# Patient Record
Sex: Male | Born: 1980 | Race: Black or African American | Hispanic: No | Marital: Single | Smoking: Current every day smoker
Health system: Southern US, Community
[De-identification: ages and names within clinical notes are randomized; demographics above are authoritative.]

## PROBLEM LIST (undated history)

## (undated) DIAGNOSIS — Z789 Other specified health status: Secondary | ICD-10-CM

## (undated) DIAGNOSIS — R7303 Prediabetes: Secondary | ICD-10-CM

---

## 2015-11-03 ENCOUNTER — Emergency Department (HOSPITAL_COMMUNITY): Payer: Self-pay

## 2015-11-03 ENCOUNTER — Encounter (HOSPITAL_COMMUNITY): Payer: Self-pay

## 2015-11-03 ENCOUNTER — Emergency Department (HOSPITAL_COMMUNITY)
Admission: EM | Admit: 2015-11-03 | Discharge: 2015-11-03 | Disposition: A | Discharge status: Home / Self Care | Payer: Self-pay | Attending: Physician Assistant | Admitting: Physician Assistant

## 2015-11-03 DIAGNOSIS — R1084 Generalized abdominal pain: Secondary | ICD-10-CM | POA: Insufficient documentation

## 2015-11-03 DIAGNOSIS — F17210 Nicotine dependence, cigarettes, uncomplicated: Secondary | ICD-10-CM | POA: Insufficient documentation

## 2015-11-03 LAB — COMPREHENSIVE METABOLIC PANEL
ALBUMIN: 4 g/dL (ref 3.5–5.0)
ALT: 18 U/L (ref 17–63)
ANION GAP: 7 (ref 5–15)
AST: 24 U/L (ref 15–41)
Alkaline Phosphatase: 73 U/L (ref 38–126)
BILIRUBIN TOTAL: 0.4 mg/dL (ref 0.3–1.2)
BUN: 7 mg/dL (ref 6–20)
CALCIUM: 9 mg/dL (ref 8.9–10.3)
CO2: 24 mmol/L (ref 22–32)
Chloride: 106 mmol/L (ref 101–111)
Creatinine, Ser: 1.01 mg/dL (ref 0.61–1.24)
GLUCOSE: 102 mg/dL — AB (ref 65–99)
POTASSIUM: 3.5 mmol/L (ref 3.5–5.1)
Sodium: 137 mmol/L (ref 135–145)
TOTAL PROTEIN: 7.1 g/dL (ref 6.5–8.1)

## 2015-11-03 LAB — CBC
HEMATOCRIT: 45.3 % (ref 39.0–52.0)
HEMOGLOBIN: 15.2 g/dL (ref 13.0–17.0)
MCH: 26.2 pg (ref 26.0–34.0)
MCHC: 33.6 g/dL (ref 30.0–36.0)
MCV: 78 fL (ref 78.0–100.0)
Platelets: 186 10*3/uL (ref 150–400)
RBC: 5.81 MIL/uL (ref 4.22–5.81)
RDW: 14 % (ref 11.5–15.5)
WBC: 9.9 10*3/uL (ref 4.0–10.5)

## 2015-11-03 LAB — URINALYSIS, ROUTINE W REFLEX MICROSCOPIC
BILIRUBIN URINE: NEGATIVE
Glucose, UA: NEGATIVE mg/dL
Hgb urine dipstick: NEGATIVE
Ketones, ur: NEGATIVE mg/dL
LEUKOCYTES UA: NEGATIVE
NITRITE: NEGATIVE
PH: 6 (ref 5.0–8.0)
Protein, ur: NEGATIVE mg/dL
SPECIFIC GRAVITY, URINE: 1.027 (ref 1.005–1.030)

## 2015-11-03 LAB — LIPASE, BLOOD: Lipase: 20 U/L (ref 11–51)

## 2015-11-03 MED ORDER — SODIUM CHLORIDE 0.9 % IV BOLUS (SEPSIS)
1000.0000 mL | Freq: Once | INTRAVENOUS | Status: AC
  Administered 2015-11-03: 1000 mL via INTRAVENOUS

## 2015-11-03 MED ORDER — IOPAMIDOL (ISOVUE-300) INJECTION 61%
INTRAVENOUS | Status: AC
  Administered 2015-11-03: 100 mL
  Filled 2015-11-03: qty 100

## 2015-11-03 MED ORDER — MORPHINE SULFATE (PF) 4 MG/ML IV SOLN
4.0000 mg | Freq: Once | INTRAVENOUS | Status: AC
  Administered 2015-11-03: 4 mg via INTRAVENOUS
  Filled 2015-11-03: qty 1

## 2015-11-03 MED ORDER — POLYETHYLENE GLYCOL 3350 17 G PO PACK: 17.0000 g | PACK | Freq: Every day | ORAL | 0 refills | Status: DC

## 2015-11-03 MED ORDER — ONDANSETRON HCL 4 MG/2ML IJ SOLN
4.0000 mg | Freq: Once | INTRAMUSCULAR | Status: AC
  Administered 2015-11-03: 4 mg via INTRAVENOUS
  Filled 2015-11-03: qty 2

## 2015-11-03 NOTE — ED Notes (Signed)
Patient transported to CT via stretcher.

## 2015-11-03 NOTE — ED Provider Notes (Signed)
TIME SEEN: .5:50 pm  CHIEF COMPLAINT: abdominal pain and constipation  HPI: patient comes to the ER for evaluation of abdominal pain that is diffuse and started this morning,  He has associated constipation. He did have a small bowel movement  With only a little passage of stool. He had sensation of needing to have more bowel movement but unable. Pain radiating to groin and down in left leg. No fever V/N/D, no pain when he urinates, no rectal, no penile discharge  ROS: See HPI Constitutional: no fever  Eyes: no drainage  ENT: no runny nose   Cardiovascular:  no chest pain  Resp: no SOB  GI: no vomiting GU: no dysuria Integumentary: no rash  Allergy: no hives  Musculoskeletal: no leg swelling  Neurological: no slurred speech ROS otherwise negative  PAST MEDICAL HISTORY/PAST SURGICAL HISTORY:  History reviewed. No pertinent past medical history.  MEDICATIONS:  Prior to Admission medications   Not on File    ALLERGIES:  No Known Allergies  SOCIAL HISTORY:  Social History  Substance Use Topics  . Smoking status: Current Every Day Smoker    Types: Cigarettes  . Smokeless tobacco: Never Used  . Alcohol use Not on file    FAMILY HISTORY: No family history on file.  EXAM: BP 129/71 (BP Location: Right Arm)   Pulse 72   Temp 98.7 F (37.1 C) (Oral)   Resp 18   Ht 5\' 9"  (1.753 m)   Wt 74.8 kg   SpO2 98%   BMI 24.37 kg/m  CONSTITUTIONAL: Alert and oriented and responds appropriately to questions. Well-appearing; well-nourished HEAD: Normocephalic EYES: Conjunctivae clear, PERRL ENT: normal nose; no rhinorrhea; moist mucous membranes NECK: Supple, no meningismus, no LAD  CARD: RRR;  no murmurs, no clicks, no rubs, no gallops RESP: Normal chest effort of breathing. No tachypnea, excursion without; breath sounds clear and equal bilaterally; no wheezes, no rhonchi, no rales, no hypoxia or respiratory distress, speaking full sentences ABD/GI: Normal bowel sounds;  non-distended; soft, no rebound, no guarding, no peritoneal signs, mild diffuse tenderness to abdomen. No hernia palpated GU: no testicular tenderness of swelling, no inguinal hernia appreciated. Testicles are symmetrical. BACK:  The back appears normal and is non-tender to palpation, there is no CVA tenderness EXT: Normal ROM in all joints; non-tender to palpation; no edema; normal capillary refill; no cyanosis, no calf tenderness or swelling    SKIN: Normal color for age and race; warm; no rash NEURO: Moves all extremities equally, sensation to light touch intact diffusely, cranial nerves II through XII intact PSYCH: The patient's mood and manner are appropriate. Grooming and personal hygiene are appropriate.  ED COURSE:  Since urinalysis, CBC, BMC and CT abdomen and pelvis are unremarkable.  Patient is nontoxic, nonseptic appearing, in no apparent distress.  Patient's pain and other symptoms adequately managed in emergency department.  Fluid bolus given.  Labs, imaging and vitals reviewed.  Patient does not meet the SIRS or Sepsis criteria.  On repeat exam patient does not have a surgical abdomin and there are no peritoneal signs.  No indication of appendicitis, bowel obstruction, bowel perforation, cholecystitis, diverticulitis. .  Patient discharged home with symptomatic treatment and given strict instructions for follow-up with their primary care physician.  I have also discussed reasons to return immediately to the ER.  Patient expresses understanding and agrees with plan.  Rx: Miralax  Diagnosis: Abdominal pain       Marlon Peliffany Issaih Kaus, PA-C 11/03/15 1951    Courteney Lyn Mackuen,  MD 11/08/15 1610

## 2015-11-03 NOTE — ED Triage Notes (Signed)
Patient here with generalized abdominal pain with radiation to testicles since early am, denies urinary systems. Patient denies nausea, denies vomiting, denies diarrhea

## 2017-06-24 ENCOUNTER — Emergency Department (HOSPITAL_COMMUNITY): Payer: Self-pay

## 2017-06-24 ENCOUNTER — Encounter (HOSPITAL_COMMUNITY): Payer: Self-pay | Admitting: Emergency Medicine

## 2017-06-24 DIAGNOSIS — F17210 Nicotine dependence, cigarettes, uncomplicated: Secondary | ICD-10-CM | POA: Diagnosis present

## 2017-06-24 DIAGNOSIS — K6131 Horseshoe abscess: Principal | ICD-10-CM | POA: Diagnosis present

## 2017-06-24 DIAGNOSIS — E119 Type 2 diabetes mellitus without complications: Secondary | ICD-10-CM | POA: Diagnosis present

## 2017-06-24 LAB — CBC
HEMATOCRIT: 41.1 % (ref 39.0–52.0)
HEMOGLOBIN: 13.8 g/dL (ref 13.0–17.0)
MCH: 26 pg (ref 26.0–34.0)
MCHC: 33.6 g/dL (ref 30.0–36.0)
MCV: 77.4 fL — ABNORMAL LOW (ref 78.0–100.0)
Platelets: 302 10*3/uL (ref 150–400)
RBC: 5.31 MIL/uL (ref 4.22–5.81)
RDW: 12.9 % (ref 11.5–15.5)
WBC: 15.2 10*3/uL — ABNORMAL HIGH (ref 4.0–10.5)

## 2017-06-24 LAB — COMPREHENSIVE METABOLIC PANEL
ALBUMIN: 2.7 g/dL — AB (ref 3.5–5.0)
ALT: 15 U/L — ABNORMAL LOW (ref 17–63)
ANION GAP: 12 (ref 5–15)
AST: 25 U/L (ref 15–41)
Alkaline Phosphatase: 93 U/L (ref 38–126)
BUN: <5 mg/dL — ABNORMAL LOW (ref 6–20)
CO2: 24 mmol/L (ref 22–32)
Calcium: 8.4 mg/dL — ABNORMAL LOW (ref 8.9–10.3)
Chloride: 94 mmol/L — ABNORMAL LOW (ref 101–111)
Creatinine, Ser: 0.94 mg/dL (ref 0.61–1.24)
GFR calc Af Amer: >60 mL/min (ref >60)
GFR calc non Af Amer: >60 mL/min (ref >60)
GLUCOSE: 187 mg/dL — AB (ref 65–99)
POTASSIUM: 3.2 mmol/L — AB (ref 3.5–5.1)
SODIUM: 130 mmol/L — AB (ref 135–145)
Total Bilirubin: 0.4 mg/dL (ref 0.3–1.2)
Total Protein: 7.1 g/dL (ref 6.5–8.1)

## 2017-06-24 LAB — URINALYSIS, ROUTINE W REFLEX MICROSCOPIC
BILIRUBIN URINE: NEGATIVE
Glucose, UA: 150 mg/dL — AB
HGB URINE DIPSTICK: NEGATIVE
Ketones, ur: NEGATIVE mg/dL
Leukocytes, UA: NEGATIVE
NITRITE: NEGATIVE
PH: 6 (ref 5.0–8.0)
Protein, ur: NEGATIVE mg/dL
SPECIFIC GRAVITY, URINE: 1.018 (ref 1.005–1.030)

## 2017-06-24 NOTE — ED Triage Notes (Signed)
Pt reports rectal pain X1 week, pt states he thinks he has a hemorrhoid. Pt also reports L testicle pain X few days, states all this pain started after having a cold with several coughing episodes.

## 2017-06-25 ENCOUNTER — Emergency Department (HOSPITAL_COMMUNITY): Payer: Self-pay | Admitting: Anesthesiology

## 2017-06-25 ENCOUNTER — Encounter (HOSPITAL_COMMUNITY): Admission: EM | Disposition: A | Discharge status: Home / Self Care | Payer: Self-pay | Source: Home / Self Care

## 2017-06-25 ENCOUNTER — Emergency Department (HOSPITAL_COMMUNITY): Payer: Self-pay

## 2017-06-25 ENCOUNTER — Encounter (HOSPITAL_COMMUNITY): Payer: Self-pay | Admitting: Anesthesiology

## 2017-06-25 ENCOUNTER — Inpatient Hospital Stay (HOSPITAL_COMMUNITY)
Admission: EM | Admit: 2017-06-25 | Discharge: 2017-06-27 | DRG: 395 | Disposition: A | Discharge status: Home / Self Care | Payer: Self-pay | Attending: General Surgery | Admitting: General Surgery

## 2017-06-25 ENCOUNTER — Other Ambulatory Visit: Payer: Self-pay

## 2017-06-25 DIAGNOSIS — N50812 Left testicular pain: Secondary | ICD-10-CM

## 2017-06-25 DIAGNOSIS — D72829 Elevated white blood cell count, unspecified: Secondary | ICD-10-CM

## 2017-06-25 DIAGNOSIS — K611 Rectal abscess: Secondary | ICD-10-CM | POA: Diagnosis present

## 2017-06-25 DIAGNOSIS — K6289 Other specified diseases of anus and rectum: Secondary | ICD-10-CM

## 2017-06-25 DIAGNOSIS — R102 Pelvic and perineal pain: Secondary | ICD-10-CM

## 2017-06-25 HISTORY — PX: ABSCESS DRAINAGE: SHX1119

## 2017-06-25 HISTORY — PX: INCISION AND DRAINAGE PERIRECTAL ABSCESS: SHX1804

## 2017-06-25 HISTORY — DX: Other specified health status: Z78.9

## 2017-06-25 LAB — HIV ANTIBODY (ROUTINE TESTING W REFLEX): HIV SCREEN 4TH GENERATION: NONREACTIVE

## 2017-06-25 LAB — CBC
HCT: 38.7 % — ABNORMAL LOW (ref 39.0–52.0)
HEMOGLOBIN: 12.6 g/dL — AB (ref 13.0–17.0)
MCH: 25.3 pg — AB (ref 26.0–34.0)
MCHC: 32.6 g/dL (ref 30.0–36.0)
MCV: 77.6 fL — ABNORMAL LOW (ref 78.0–100.0)
Platelets: 288 10*3/uL (ref 150–400)
RBC: 4.99 MIL/uL (ref 4.22–5.81)
RDW: 13 % (ref 11.5–15.5)
WBC: 17.8 10*3/uL — AB (ref 4.0–10.5)

## 2017-06-25 LAB — HEMOGLOBIN A1C
Hgb A1c MFr Bld: 5.9 % — ABNORMAL HIGH (ref 4.8–5.6)
Mean Plasma Glucose: 122.63 mg/dL

## 2017-06-25 LAB — GLUCOSE, CAPILLARY
GLUCOSE-CAPILLARY: 111 mg/dL — AB (ref 65–99)
GLUCOSE-CAPILLARY: 166 mg/dL — AB (ref 65–99)
GLUCOSE-CAPILLARY: 100 mg/dL — AB (ref 65–99)

## 2017-06-25 LAB — CREATININE, SERUM
CREATININE: 0.8 mg/dL (ref 0.61–1.24)
GFR calc non Af Amer: >60 mL/min (ref >60)

## 2017-06-25 LAB — POC OCCULT BLOOD, ED: FECAL OCCULT BLD: NEGATIVE

## 2017-06-25 LAB — I-STAT CG4 LACTIC ACID, ED: Lactic Acid, Venous: 1.01 mmol/L (ref 0.5–1.9)

## 2017-06-25 SURGERY — Surgical Case
Anesthesia: *Unknown

## 2017-06-25 SURGERY — EXAM UNDER ANESTHESIA
Anesthesia: General | Site: Rectum

## 2017-06-25 MED ORDER — DEXAMETHASONE SODIUM PHOSPHATE 10 MG/ML IJ SOLN
INTRAMUSCULAR | Status: AC
  Filled 2017-06-25: qty 1

## 2017-06-25 MED ORDER — ONDANSETRON HCL 4 MG/2ML IJ SOLN
INTRAMUSCULAR | Status: AC
  Filled 2017-06-25: qty 2

## 2017-06-25 MED ORDER — LACTATED RINGERS IV SOLN
INTRAVENOUS | Status: DC
  Administered 2017-06-25: 09:00:00 via INTRAVENOUS

## 2017-06-25 MED ORDER — FENTANYL CITRATE (PF) 100 MCG/2ML IJ SOLN
50.0000 ug | Freq: Once | INTRAMUSCULAR | Status: AC
  Administered 2017-06-25: 50 ug via INTRAVENOUS
  Filled 2017-06-25: qty 2

## 2017-06-25 MED ORDER — ONDANSETRON HCL 4 MG/2ML IJ SOLN: 4.0000 mg | Freq: Once | INTRAMUSCULAR | Status: DC | PRN

## 2017-06-25 MED ORDER — SUCCINYLCHOLINE CHLORIDE 200 MG/10ML IV SOSY
PREFILLED_SYRINGE | INTRAVENOUS | Status: DC | PRN
  Administered 2017-06-25: 120 mg via INTRAVENOUS

## 2017-06-25 MED ORDER — ACETAMINOPHEN 650 MG RE SUPP: 650.0000 mg | Freq: Four times a day (QID) | RECTAL | Status: DC | PRN

## 2017-06-25 MED ORDER — LIDOCAINE HCL (CARDIAC) 20 MG/ML IV SOLN
INTRAVENOUS | Status: AC
  Filled 2017-06-25: qty 5

## 2017-06-25 MED ORDER — SODIUM CHLORIDE 0.9 % IV BOLUS (SEPSIS)
1000.0000 mL | Freq: Once | INTRAVENOUS | Status: AC
  Administered 2017-06-25: 1000 mL via INTRAVENOUS

## 2017-06-25 MED ORDER — IOPAMIDOL (ISOVUE-300) INJECTION 61%
INTRAVENOUS | Status: AC
  Administered 2017-06-25: 100 mL
  Filled 2017-06-25: qty 100

## 2017-06-25 MED ORDER — METHOCARBAMOL 500 MG PO TABS
500.0000 mg | ORAL_TABLET | Freq: Three times a day (TID) | ORAL | Status: DC | PRN
  Administered 2017-06-25 – 2017-06-26 (×3): 500 mg via ORAL
  Filled 2017-06-25 (×3): qty 1

## 2017-06-25 MED ORDER — DEXAMETHASONE SODIUM PHOSPHATE 10 MG/ML IJ SOLN
INTRAMUSCULAR | Status: DC | PRN
  Administered 2017-06-25: 10 mg via INTRAVENOUS

## 2017-06-25 MED ORDER — SODIUM CHLORIDE 0.9 % IV SOLN: 2.0000 g | Freq: Every day | INTRAVENOUS | Status: DC

## 2017-06-25 MED ORDER — BUPIVACAINE-EPINEPHRINE 0.25% -1:200000 IJ SOLN
INTRAMUSCULAR | Status: DC | PRN
  Administered 2017-06-25: 14 mL

## 2017-06-25 MED ORDER — MORPHINE SULFATE (PF) 4 MG/ML IV SOLN: 2.0000 mg | INTRAVENOUS | Status: DC | PRN

## 2017-06-25 MED ORDER — FENTANYL CITRATE (PF) 250 MCG/5ML IJ SOLN
INTRAMUSCULAR | Status: AC
  Filled 2017-06-25: qty 5

## 2017-06-25 MED ORDER — SUCCINYLCHOLINE CHLORIDE 200 MG/10ML IV SOSY
PREFILLED_SYRINGE | INTRAVENOUS | Status: AC
  Filled 2017-06-25: qty 10

## 2017-06-25 MED ORDER — VANCOMYCIN HCL 10 G IV SOLR
1500.0000 mg | Freq: Two times a day (BID) | INTRAVENOUS | Status: DC
  Administered 2017-06-25: 1500 mg via INTRAVENOUS
  Filled 2017-06-25 (×2): qty 1500

## 2017-06-25 MED ORDER — PIPERACILLIN-TAZOBACTAM 3.375 G IVPB
3.3750 g | Freq: Three times a day (TID) | INTRAVENOUS | Status: DC
  Administered 2017-06-25 – 2017-06-27 (×7): 3.375 g via INTRAVENOUS
  Filled 2017-06-25 (×8): qty 50

## 2017-06-25 MED ORDER — ONDANSETRON HCL 4 MG/2ML IJ SOLN
INTRAMUSCULAR | Status: DC | PRN
  Administered 2017-06-25: 4 mg via INTRAVENOUS

## 2017-06-25 MED ORDER — METOPROLOL TARTRATE 5 MG/5ML IV SOLN: 5.0000 mg | Freq: Four times a day (QID) | INTRAVENOUS | Status: DC | PRN

## 2017-06-25 MED ORDER — PIPERACILLIN-TAZOBACTAM 3.375 G IVPB 30 MIN
3.3750 g | Freq: Once | INTRAVENOUS | Status: AC
  Administered 2017-06-25: 3.375 g via INTRAVENOUS
  Filled 2017-06-25: qty 50

## 2017-06-25 MED ORDER — PROPOFOL 10 MG/ML IV BOLUS
INTRAVENOUS | Status: DC | PRN
  Administered 2017-06-25: 200 mg via INTRAVENOUS

## 2017-06-25 MED ORDER — ENOXAPARIN SODIUM 40 MG/0.4ML ~~LOC~~ SOLN
40.0000 mg | SUBCUTANEOUS | Status: DC
  Administered 2017-06-25 – 2017-06-26 (×2): 40 mg via SUBCUTANEOUS
  Filled 2017-06-25 (×2): qty 0.4

## 2017-06-25 MED ORDER — FENTANYL CITRATE (PF) 250 MCG/5ML IJ SOLN
INTRAMUSCULAR | Status: DC | PRN
  Administered 2017-06-25 (×4): 50 ug via INTRAVENOUS
  Administered 2017-06-25 (×2): 100 ug via INTRAVENOUS

## 2017-06-25 MED ORDER — VANCOMYCIN HCL IN DEXTROSE 1-5 GM/200ML-% IV SOLN: 1000.0000 mg | Freq: Once | INTRAVENOUS | Status: DC

## 2017-06-25 MED ORDER — DIPHENHYDRAMINE HCL 50 MG/ML IJ SOLN: 25.0000 mg | Freq: Four times a day (QID) | INTRAMUSCULAR | Status: DC | PRN

## 2017-06-25 MED ORDER — OXYCODONE HCL 5 MG/5ML PO SOLN: 5.0000 mg | Freq: Once | ORAL | Status: DC | PRN

## 2017-06-25 MED ORDER — FENTANYL CITRATE (PF) 100 MCG/2ML IJ SOLN: 25.0000 ug | INTRAMUSCULAR | Status: DC | PRN

## 2017-06-25 MED ORDER — MIDAZOLAM HCL 2 MG/2ML IJ SOLN
INTRAMUSCULAR | Status: AC
  Filled 2017-06-25: qty 2

## 2017-06-25 MED ORDER — DIPHENHYDRAMINE HCL 25 MG PO CAPS: 25.0000 mg | ORAL_CAPSULE | Freq: Four times a day (QID) | ORAL | Status: DC | PRN

## 2017-06-25 MED ORDER — CLINDAMYCIN PHOSPHATE 600 MG/50ML IV SOLN: 600.0000 mg | Freq: Once | INTRAVENOUS | Status: DC

## 2017-06-25 MED ORDER — LIDOCAINE HCL 2 % EX GEL
CUTANEOUS | Status: AC
  Filled 2017-06-25: qty 20

## 2017-06-25 MED ORDER — INSULIN ASPART 100 UNIT/ML ~~LOC~~ SOLN
0.0000 [IU] | Freq: Three times a day (TID) | SUBCUTANEOUS | Status: DC
  Administered 2017-06-25: 3 [IU] via SUBCUTANEOUS

## 2017-06-25 MED ORDER — ONDANSETRON 4 MG PO TBDP: 4.0000 mg | ORAL_TABLET | Freq: Four times a day (QID) | ORAL | Status: DC | PRN

## 2017-06-25 MED ORDER — LIDOCAINE 2% (20 MG/ML) 5 ML SYRINGE
INTRAMUSCULAR | Status: DC | PRN
  Administered 2017-06-25: 60 mg via INTRAVENOUS

## 2017-06-25 MED ORDER — 0.9 % SODIUM CHLORIDE (POUR BTL) OPTIME
TOPICAL | Status: DC | PRN
  Administered 2017-06-25: 1000 mL

## 2017-06-25 MED ORDER — ACETAMINOPHEN 325 MG PO TABS
650.0000 mg | ORAL_TABLET | Freq: Four times a day (QID) | ORAL | Status: DC | PRN
  Administered 2017-06-26: 650 mg via ORAL
  Filled 2017-06-25: qty 2

## 2017-06-25 MED ORDER — POTASSIUM CHLORIDE 10 MEQ/100ML IV SOLN
10.0000 meq | Freq: Once | INTRAVENOUS | Status: AC
  Administered 2017-06-25: 10 meq via INTRAVENOUS
  Filled 2017-06-25: qty 100

## 2017-06-25 MED ORDER — OXYCODONE HCL 5 MG PO TABS: 5.0000 mg | ORAL_TABLET | ORAL | Status: DC | PRN

## 2017-06-25 MED ORDER — OXYCODONE HCL 5 MG PO TABS: 5.0000 mg | ORAL_TABLET | Freq: Once | ORAL | Status: DC | PRN

## 2017-06-25 MED ORDER — CLINDAMYCIN PHOSPHATE 900 MG/50ML IV SOLN
900.0000 mg | Freq: Once | INTRAVENOUS | Status: AC
  Administered 2017-06-25: 900 mg via INTRAVENOUS
  Filled 2017-06-25: qty 50

## 2017-06-25 MED ORDER — MIDAZOLAM HCL 5 MG/5ML IJ SOLN
INTRAMUSCULAR | Status: DC | PRN
  Administered 2017-06-25: 2 mg via INTRAVENOUS

## 2017-06-25 MED ORDER — OXYCODONE HCL 5 MG PO TABS
5.0000 mg | ORAL_TABLET | ORAL | Status: DC | PRN
  Administered 2017-06-25 – 2017-06-26 (×2): 5 mg via ORAL
  Filled 2017-06-25 (×2): qty 1

## 2017-06-25 MED ORDER — BUPIVACAINE-EPINEPHRINE (PF) 0.25% -1:200000 IJ SOLN
INTRAMUSCULAR | Status: AC
  Filled 2017-06-25: qty 30

## 2017-06-25 MED ORDER — ONDANSETRON HCL 4 MG/2ML IJ SOLN: 4.0000 mg | Freq: Four times a day (QID) | INTRAMUSCULAR | Status: DC | PRN

## 2017-06-25 MED ORDER — MORPHINE SULFATE (PF) 4 MG/ML IV SOLN: 1.0000 mg | INTRAVENOUS | Status: DC | PRN

## 2017-06-25 MED ORDER — KCL IN DEXTROSE-NACL 20-5-0.45 MEQ/L-%-% IV SOLN
INTRAVENOUS | Status: DC
  Administered 2017-06-25 – 2017-06-26 (×2): via INTRAVENOUS
  Filled 2017-06-25 (×2): qty 1000

## 2017-06-25 MED ORDER — SODIUM CHLORIDE 0.9 % IV SOLN
1000.0000 mL | INTRAVENOUS | Status: DC
  Administered 2017-06-25: 1000 mL via INTRAVENOUS

## 2017-06-25 MED ORDER — PROPOFOL 10 MG/ML IV BOLUS
INTRAVENOUS | Status: AC
  Filled 2017-06-25: qty 40

## 2017-06-25 MED ORDER — ACETAMINOPHEN 325 MG PO TABS: 650.0000 mg | ORAL_TABLET | Freq: Four times a day (QID) | ORAL | Status: DC | PRN

## 2017-06-25 SURGICAL SUPPLY — 42 items
CANISTER SUCT 3000ML PPV (MISCELLANEOUS) ×3 IMPLANT
COVER SURGICAL LIGHT HANDLE (MISCELLANEOUS) ×3 IMPLANT
DECANTER SPIKE VIAL GLASS SM (MISCELLANEOUS) ×3 IMPLANT
DRAIN PENROSE 1/2X12 LTX STRL (WOUND CARE) ×3 IMPLANT
DRAPE LAPAROTOMY 100X72 PEDS (DRAPES) ×3 IMPLANT
ELECT CAUTERY BLADE 6.4 (BLADE) ×3 IMPLANT
ELECT REM PT RETURN 9FT ADLT (ELECTROSURGICAL) ×3
ELECTRODE REM PT RTRN 9FT ADLT (ELECTROSURGICAL) ×1 IMPLANT
GAUZE PACKING IODOFORM 2 (PACKING) ×3 IMPLANT
GAUZE SPONGE 4X4 12PLY STRL (GAUZE/BANDAGES/DRESSINGS) ×3 IMPLANT
GLOVE BIO SURGEON STRL SZ 6.5 (GLOVE) ×2 IMPLANT
GLOVE BIO SURGEON STRL SZ7 (GLOVE) ×3 IMPLANT
GLOVE BIO SURGEONS STRL SZ 6.5 (GLOVE) ×1
GLOVE BIOGEL PI IND STRL 6.5 (GLOVE) ×1 IMPLANT
GLOVE BIOGEL PI IND STRL 7.5 (GLOVE) ×1 IMPLANT
GLOVE BIOGEL PI IND STRL 8.5 (GLOVE) ×1 IMPLANT
GLOVE BIOGEL PI INDICATOR 6.5 (GLOVE) ×2
GLOVE BIOGEL PI INDICATOR 7.5 (GLOVE) ×2
GLOVE BIOGEL PI INDICATOR 8.5 (GLOVE) ×2
GLOVE ECLIPSE 8.0 STRL XLNG CF (GLOVE) ×3 IMPLANT
GOWN STRL REUS W/ TWL LRG LVL3 (GOWN DISPOSABLE) ×2 IMPLANT
GOWN STRL REUS W/ TWL XL LVL3 (GOWN DISPOSABLE) ×1 IMPLANT
GOWN STRL REUS W/TWL LRG LVL3 (GOWN DISPOSABLE) ×4
GOWN STRL REUS W/TWL XL LVL3 (GOWN DISPOSABLE) ×2
KIT BASIN OR (CUSTOM PROCEDURE TRAY) ×3 IMPLANT
KIT TURNOVER KIT B (KITS) ×3 IMPLANT
NS IRRIG 1000ML POUR BTL (IV SOLUTION) ×3 IMPLANT
PACK GENERAL/GYN (CUSTOM PROCEDURE TRAY) ×3 IMPLANT
PAD ABD 8X10 STRL (GAUZE/BANDAGES/DRESSINGS) ×3 IMPLANT
PAD ARMBOARD 7.5X6 YLW CONV (MISCELLANEOUS) ×6 IMPLANT
SPONGE SURGIFOAM ABS GEL 100 (HEMOSTASIS) IMPLANT
STAPLER VISISTAT 35W (STAPLE) ×3 IMPLANT
SURGILUBE 2OZ TUBE FLIPTOP (MISCELLANEOUS) IMPLANT
SUT CHROMIC 2 0 SH (SUTURE) IMPLANT
SUT ETHILON 2 0 FS 18 (SUTURE) ×3 IMPLANT
SUT MON AB 3-0 SH 27 (SUTURE)
SUT MON AB 3-0 SH27 (SUTURE) IMPLANT
SWAB COLLECTION DEVICE MRSA (MISCELLANEOUS) ×3 IMPLANT
SWAB CULTURE ESWAB REG 1ML (MISCELLANEOUS) ×3 IMPLANT
SYR CONTROL 10ML LL (SYRINGE) ×3 IMPLANT
TOWEL GREEN STERILE (TOWEL DISPOSABLE) ×3 IMPLANT
UNDERPAD 30X30 (UNDERPADS AND DIAPERS) ×3 IMPLANT

## 2017-06-25 NOTE — ED Provider Notes (Signed)
[facility]   CSN: [id number] Arrival date & time: 06/24/17  2213     History   Chief Complaint Chief Complaint  Patient presents with  . Rectal Pain  . Testicle Pain    HPI Eugene Shelton is a 37 y.o. male with a hx of no major medical problems presents to the Emergency Department complaining of gradual, persistent, progressively worsening rectal and testicular pain onset 1 week ago.  Pt reports recent URI with coughing.  Pt reports his pain is worsening with coughing.  Pt reports "pressure" with any increase in intraabdominal pressure.  Pt has taken aleve and preporation H without relief.  Rectal pain is worsened with sitting and testicular pain is worsened with lifting his testicle.  Pt denies radiation of pain, abd pain, flank pain, N/V, dysuria, hematuria, melena hematochezia, penile discharge.  Last BM was several days ago and does have a hx of constipation, but most recent stools have been loose.  Patient denies melena or hematochezia.  Patient does report multiple episodes of diarrhea since the onset of his URI.  He reports subjective fevers at home.  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  The history is provided by the patient and medical records. No language interpreter was used.    History reviewed. No pertinent past medical history.  There are no active problems to display for this patient.   History reviewed. No pertinent surgical history.      Home Medications    Prior to Admission medications   Medication Sig Start Date End Date Taking? Authorizing Provider  acetaminophen (TYLENOL) 500 MG tablet Take 1,000 mg by mouth every 6 (six) hours as needed for mild pain.   Yes [provider]  ibuprofen (ADVIL,MOTRIN) 200 MG tablet Take 400 mg by mouth every 6 (six) hours as needed for moderate pain.   Yes [provider]  naproxen sodium  (ALEVE) 220 MG tablet Take 220-440 mg by mouth 2 (two) times daily as needed (pain).   Yes [provider]    Family History No family history on file.  Social History Social History   Tobacco Use  . Smoking status: Current Every Day Smoker    Packs/day: 0.50    Types: Cigarettes  . Smokeless tobacco: Never Used  Substance Use Topics  . Alcohol use: Yes    Comment: Socially  . Drug use: Not Currently     Allergies   Patient has no known allergies.   Review of Systems Review of Systems  Constitutional: Negative for appetite change, diaphoresis, fatigue, fever and unexpected weight change.  HENT: Negative for mouth sores.   Eyes: Negative for visual disturbance.  Respiratory: Negative for cough, chest tightness, shortness of breath and wheezing.   Cardiovascular: Negative for chest pain.  Gastrointestinal: Negative for abdominal pain, constipation, diarrhea, nausea and vomiting.       Rectal pain  Endocrine: Negative for polydipsia, polyphagia and polyuria.  Genitourinary: Positive for testicular pain. Negative for discharge, dysuria, frequency, hematuria, penile pain, penile swelling and urgency.  Musculoskeletal: Negative for back pain and neck stiffness.  Skin: Negative for rash.  Allergic/Immunologic: Negative for immunocompromised state.  Neurological: Negative for syncope, light-headedness and headaches.  Hematological: Does not bruise/bleed easily.  Psychiatric/Behavioral: Negative for sleep disturbance. The patient is not nervous/anxious.      Physical Exam Updated Vital Signs BP 127/88 (BP Location: Right Arm)   Pulse (!) 126  Temp 98.7 F (37.1 C) (Oral)   Resp 18   Ht 5\' 10"  (1.778 m)   Wt 70.3 kg (155 lb)   SpO2 100%   BMI 22.24 kg/m   Physical Exam  Constitutional: He appears well-developed and well-nourished. No distress.  Awake, alert, nontoxic appearance  HENT:  Head: Normocephalic and atraumatic.  Mouth/Throat: Oropharynx is  clear and moist. No oropharyngeal exudate.  Eyes: Conjunctivae are normal. No scleral icterus.  Neck: Normal range of motion. Neck supple.  Cardiovascular: Normal rate, regular rhythm and intact distal pulses.  Pulmonary/Chest: Effort normal and breath sounds normal. No respiratory distress. He has no wheezes.  Equal chest expansion  Abdominal: Soft. Bowel sounds are normal. He exhibits no mass. There is no tenderness. There is no rebound and no guarding. Hernia confirmed negative in the right inguinal area and confirmed negative in the left inguinal area.  Genitourinary: Testes normal and penis normal. Rectal exam shows mass and tenderness. Right testis shows no mass, no swelling and no tenderness. Right testis is descended. Cremasteric reflex is not absent on the right side. Left testis shows no mass, no swelling and no tenderness. Left testis is descended. Cremasteric reflex is not absent on the left side. Uncircumcised.  Genitourinary Comments: Chaperone present Severe tenderness to palpation along the perineum, no palpable crepitus Severe tenderness on DRE.  Brown stool in the rectal vault.  Palpable perirectal induration and fluctuance to the 9:00 through 12 o'clock position of the rectum Unable to palpate prostate due to severe pain on DRE.  Musculoskeletal: Normal range of motion. He exhibits no edema.  Lymphadenopathy: Inguinal adenopathy noted on the left side. No inguinal adenopathy noted on the right side.  Neurological: He is alert.  Speech is clear and goal oriented Moves extremities without ataxia  Skin: Skin is warm and dry. He is not diaphoretic.  Psychiatric: He has a normal mood and affect.  Nursing note and vitals reviewed.    ED Treatments / Results  Labs (all labs ordered are listed, but only abnormal results are displayed) Labs Reviewed  COMPREHENSIVE METABOLIC PANEL - Abnormal; Notable for the following components:      Result Value   Sodium 130 (*)    Potassium  3.2 (*)    Chloride 94 (*)    Glucose, Bld 187 (*)    BUN <5 (*)    Calcium 8.4 (*)    Albumin 2.7 (*)    ALT 15 (*)    All other components within normal limits  CBC - Abnormal; Notable for the following components:   WBC 15.2 (*)    MCV 77.4 (*)    All other components within normal limits  URINALYSIS, ROUTINE W REFLEX MICROSCOPIC - Abnormal; Notable for the following components:   Glucose, UA 150 (*)    All other components within normal limits  CULTURE, BLOOD (ROUTINE X 2)  CULTURE, BLOOD (ROUTINE X 2)  HIV ANTIBODY (ROUTINE TESTING)  POC OCCULT BLOOD, ED  I-STAT CG4 LACTIC ACID, ED  I-STAT CG4 LACTIC ACID, ED     Radiology Ct Abdomen Pelvis W Contrast  Result Date: 06/25/2017 CLINICAL DATA:  37 year old male with perianal abscess. EXAM: CT ABDOMEN AND PELVIS WITH CONTRAST TECHNIQUE: Multidetector CT imaging of the abdomen and pelvis was performed using the standard protocol following bolus administration of intravenous contrast. CONTRAST:  ISOVUE-300 IOPAMIDOL (ISOVUE-300) INJECTION 61% COMPARISON:  Abdominal CT dated 11/03/2015 and testicular ultrasound dated 06/24/2017 FINDINGS: Lower chest: The visualized lung bases are clear.  No intra-abdominal free air or free fluid. Hepatobiliary: No focal liver abnormality is seen. No gallstones, gallbladder wall thickening, or biliary dilatation. Pancreas: Unremarkable. No pancreatic ductal dilatation or surrounding inflammatory changes. Spleen: Normal in size without focal abnormality. Adrenals/Urinary Tract: Adrenal glands are unremarkable. Kidneys are normal, without renal calculi, focal lesion, or hydronephrosis. Bladder is unremarkable. Stomach/Bowel: There is a 7 mm ileo ileal intussusception in the left upper abdomen, likely transient. There is no evidence of bowel obstruction or active inflammation. Normal appendix. Vascular/Lymphatic: Mild aortoiliac atherosclerotic disease. The abdominal aorta and IVC are otherwise unremarkable.  No portal venous gas. There is no adenopathy. Reproductive: The prostate and seminal vesicles are grossly unremarkable. No pelvic mass. Other: There is a 5 x 6 x 5 cm complex collection containing debris and gas with thick enhancing peripheral wall in the left and posterior perirectal soft tissues extending to the left perineum most consistent with an abscess. A fistulous tract extends from this collection to the left rectal wall (axial series 3 images 77 and 78 and coronal series 6 images 58-62). Musculoskeletal: No acute or significant osseous findings. IMPRESSION: 1. Perianal abscess with apparent fistulous tract to the lower rectum. Clinical correlation is recommended. 2. No acute intra-abdominopelvic pathology. Probable transient ileo ileal intussusception in the left upper abdomen. No bowel obstruction or active inflammation. Normal appendix. Electronically Signed   By: Elgie Collard M.D.   On: 06/25/2017 05:35   US Scrotum W/doppler  Result Date: 06/25/2017 CLINICAL DATA:  37 year old male with left testicular pain x1 week. EXAM: SCROTAL ULTRASOUND DOPPLER ULTRASOUND OF THE TESTICLES TECHNIQUE: Complete ultrasound examination of the testicles, epididymis, and other scrotal structures was performed. Color and spectral Doppler ultrasound were also utilized to evaluate blood flow to the testicles. COMPARISON:  CT of the abdomen pelvis dated 11/03/2015 FINDINGS: Right testicle Measurements: 4.2 x 2.0 x 2.7 cm. Small scattered areas of microcalcification noted, likely sequela of prior insult. There is no associated mass. Left testicle Measurements: 3.6 x 1.6 x 2.7 cm. Small scattered areas of microcalcification noted, likely sequela of prior insult. There is no associated mass. Right epididymis:  Normal in size and appearance. Left epididymis:  Normal in size and appearance. Hydrocele:  None visualized. Varicocele:  Mild left varicocele. Pulsed Doppler interrogation of both testes demonstrates normal low  resistance arterial and venous waveforms bilaterally. IMPRESSION: 1. No acute findings. Doppler detected bilateral arterial and venous flow. 2. Small foci of calcifications without associated mass, likely sequela of prior infection/inflammation. Clinical correlation is recommended. 3. Mild left testicular varicocele. Electronically Signed   By: Elgie Collard M.D.   On: 06/25/2017 00:07    Procedures Procedures (including critical care time)  Medications Ordered in ED Medications  sodium chloride 0.9 % bolus 1,000 mL (0 mLs Intravenous Stopped 06/25/17 0529)    Followed by  0.9 %  sodium chloride infusion (1,000 mLs Intravenous New Bag/Given 06/25/17 0409)  cefTRIAXone (ROCEPHIN) 2 g in sodium chloride 0.9 % 100 mL IVPB (has no administration in time range)  piperacillin-tazobactam (ZOSYN) IVPB 3.375 g (has no administration in time range)  vancomycin (VANCOCIN) IVPB 1000 mg/200 mL premix (has no administration in time range)  clindamycin (CLEOCIN) IVPB 900 mg (has no administration in time range)  fentaNYL (SUBLIMAZE) injection 50 mcg (50 mcg Intravenous Given 06/25/17 0408)  iopamidol (ISOVUE-300) 61 % injection (100 mLs  Contrast Given 06/25/17 0300)  potassium chloride 10 mEq in 100 mL IVPB (0 mEq Intravenous Stopped 06/25/17 0529)     Initial  Impression / Assessment and Plan / ED Course  I have reviewed the triage vital signs and the nursing notes.  Pertinent labs & imaging results that were available during my care of the patient were reviewed by me and considered in my medical decision making (see chart for details).  Clinical Course as of Jun 25 620  Tue Jun 25, 2017  0300 Tachycardic on arrival  Pulse Rate(!): 126 [HM]  0300 Leukocytosis  WBC(!): 15.2 [HM]  0315 Normal  Lactic Acid, Venous: 1.01 [HM]  0611 Discussed with Dr. Sheliah Hatch who will admit   [HM]  0619 Discussed with pharmacy who recommends Vanc, Zosyn and Clinda   [HM]    Clinical Course User Index [HM]  Eugene Shelton, Eugene Shelton, [place]   Patient presents with initial complaints of scrotal and rectal pain.  1.2 patient evaluated in triage and ultrasound of his scrotum was ordered.  On my exam, pain is noted to the rectum and perineum but not within the scrotum or penis.  Ultrasound without evidence of testicular torsion, orchitis or epididymitis.  Concern for Fournier's gangrene versus complicated perirectal abscess.  CT scan shows large perirectal abscess with evidence of air and a fistulous tract.  Lactic acid is normal and patient's tachycardia has improved.  No evidence of sepsis.  Will obtain blood cultures and initiate antibiotics.  Discussed with general surgery who will admit for definitive treatment.  Final Clinical Impressions(s) / ED Diagnoses   Final diagnoses:  Rectal pain  Perirectal abscess  Perineal pain in male  Leukocytosis, unspecified type    ED Discharge Orders    None       Milta Deiters 06/25/17 1610    Palumbo, April, MD 06/25/17 [phone]

## 2017-06-25 NOTE — Op Note (Signed)
Preoperative diagnosis: perirectal abscess Postoperative diagnosis: horseshoe abscess Procedure: incision and drainage perirectal abscess Surgeon: Dr Harden MoMatt Khalil Shelton Asst: Carlena BjornstadBrooke Meuth, PA-C Anesthesia: general EBL minimal Specimens cultures to micro Complications none Drains penrose drain placed  Sponge and needle count correct dispo to recovery stable  Indications: this is a 36 yom with perianal pain, elevated wbc and ct scan consistent with a perirectal abscess. I discussed incision and drainage in the OR.  Procedure: After informed consent was obtained patient was taken to the OR. He was given abx. He had SCDs in place. He was placed under general anesthesia and then rolled into the prone position and appropriately padded.  He was prepped and draped in standard sterile surgical fashion. Timeout was performed.  I performed an anal block with .25% marcaine. I anesthetized the area of induration on the left also. I made an elliptical incision and removed some skin. I then entered into a deep cavity of purulence. Cultures were taken.  There was purulence coming from his anus as well.  I then noted a plane that tracked to the right side and there was a connection.  I eventually made a radial incision on the right to connect these two.  I drained all the infection.  I then irrigated copiously.  I placed a half inch penrose to connect both sides and sutured these in place. I also packed the deep area with iodoform gauze.  Dressings were placed.  He tolerated well, was extubated and transferred to recovery stable.

## 2017-06-25 NOTE — Progress Notes (Signed)
Pharmacy Antibiotic Note  Eugene DacostaDevin Vernell Shelton is a 37 y.o. male admitted on 06/25/2017 with cellulitis.  Pharmacy has been consulted for vancomycin and Zosyn dosing.  Patient with perirectal abscess- surgery to take for I&D. He has received Zosyn 3.375g IV x1, and has orders for vancomycin 1g IV x1 and clindamycin 900mg  IV x1 placed by EDP.  Plan: Vancomycin 1500mg  IV q12h Zosyn 3.375g IV q8h EI Follow renal function, surgery plans, c/s, length of therapy   Height: 5\' 10"  (177.8 cm) Weight: 155 lb (70.3 kg) IBW/kg (Calculated) : 73  Temp (24hrs), Avg:98.7 F (37.1 C), Min:98.7 F (37.1 C), Max:98.7 F (37.1 C)  Recent Labs  Lab 06/24/17 2234 06/25/17 0303  WBC 15.2*  --   CREATININE 0.94  --   LATICACIDVEN  --  1.01    Estimated Creatinine Clearance: 109.1 mL/min (by C-G formula based on SCr of 0.94 mg/dL).    No Known Allergies  Antimicrobials this admission: Vancomycin 4/2 >>  Zosyn 4/2 >>  Clindamycin 4/2 x1  Dose adjustments this admission: n/a  Microbiology results: 4/2 BCx:   Thank you for allowing pharmacy to be a part of this patient's care.  Lesette Frary D. Tekela Garguilo, PharmD, BCPS Clinical Pharmacist Clinical Phone for 06/25/2017 until 3:30pm: [phone] If after 3:30pm, please call main pharmacy at [phone] 06/25/2017 8:07 AM

## 2017-06-25 NOTE — Anesthesia Procedure Notes (Signed)
Procedure Name: Intubation Date/Time: 06/25/2017 9:50 AM Performed by: Adria Dillonkin, Moritz Lever A, CRNA Pre-anesthesia Checklist: Patient identified, Emergency Drugs available, Suction available and Patient being monitored Patient Re-evaluated:Patient Re-evaluated prior to induction Oxygen Delivery Method: Circle system utilized Preoxygenation: Pre-oxygenation with 100% oxygen Induction Type: IV induction Ventilation: Mask ventilation without difficulty Laryngoscope Size: Miller and 3 Grade View: Grade I Tube type: Oral Tube size: 7.5 mm Number of attempts: 1 Airway Equipment and Method: Stylet Placement Confirmation: ETT inserted through vocal cords under direct vision,  positive ETCO2,  CO2 detector and breath sounds checked- equal and bilateral Secured at: 22 cm Tube secured with: Tape Dental Injury: Teeth and Oropharynx as per pre-operative assessment

## 2017-06-25 NOTE — Anesthesia Postprocedure Evaluation (Signed)
Anesthesia Post Note  Patient: Sherilyn Dacostaevin Vernell Kincade  Procedure(s) Performed: EXAM UNDER ANESTHESIA (N/A Rectum) IRRIGATION AND DEBRIDEMENT PERIRECTAL ABSCESS (N/A Rectum)     Patient location during evaluation: PACU Anesthesia Type: General Level of consciousness: awake and alert Pain management: pain level controlled Vital Signs Assessment: post-procedure vital signs reviewed and stable Respiratory status: spontaneous breathing, nonlabored ventilation, respiratory function stable and patient connected to nasal cannula oxygen Cardiovascular status: blood pressure returned to baseline and stable Postop Assessment: no apparent nausea or vomiting Anesthetic complications: no    Last Vitals:  Vitals:   06/25/17 1115 06/25/17 1133  BP:  124/79  Pulse: 97 86  Resp: 12 20  Temp:  36.7 C  SpO2: 97% 98%    Last Pain:  Vitals:   06/25/17 1133  TempSrc: Oral  PainSc:                  Brion Sossamon COKER

## 2017-06-25 NOTE — Anesthesia Preprocedure Evaluation (Signed)
Anesthesia Evaluation  Patient identified by MRN, date of birth, ID band Patient awake    Reviewed: Allergy & Precautions, NPO status , Patient's Chart, lab work & pertinent test results  Airway Mallampati: II  TM Distance: >3 FB Neck ROM: Full    Dental  (+) Teeth Intact, Dental Advisory Given   Pulmonary Current Smoker,    breath sounds clear to auscultation       Cardiovascular  Rhythm:Regular Rate:Normal     Neuro/Psych    GI/Hepatic   Endo/Other    Renal/GU      Musculoskeletal   Abdominal   Peds  Hematology   Anesthesia Other Findings   Reproductive/Obstetrics                             Anesthesia Physical Anesthesia Plan  ASA: II and emergent  Anesthesia Plan: General   Post-op Pain Management:    Induction: Intravenous  PONV Risk Score and Plan: Ondansetron and Dexamethasone  Airway Management Planned: Oral ETT  Additional Equipment:   Intra-op Plan:   Post-operative Plan: Extubation in OR  Informed Consent: I have reviewed the patients History and Physical, chart, labs and discussed the procedure including the risks, benefits and alternatives for the proposed anesthesia with the patient or authorized representative who has indicated his/her understanding and acceptance.   Dental advisory given  Plan Discussed with: CRNA and Anesthesiologist  Anesthesia Plan Comments:         Anesthesia Quick Evaluation  

## 2017-06-25 NOTE — Progress Notes (Signed)
Patient ID: Eugene Shelton, male   DOB: 09/19/1980, 37 y.o.   MRN: [id number] I have seen and examined patient.  He has tender are left buttock in perianal region with some fluctuance.  His wbc elevated and ct confirms that he has perirectal abscess. I discussed incision and drainage in the or with him. We discussed hospital stay, recovery and risks. We also discussed possible fistula that would be dealt with later.

## 2017-06-25 NOTE — Transfer of Care (Signed)
Immediate Anesthesia Transfer of Care Note  Patient: Eugene Shelton  Procedure(s) Performed: EXAM UNDER ANESTHESIA (N/A Rectum) IRRIGATION AND DEBRIDEMENT PERIRECTAL ABSCESS (N/A Rectum)  Patient Location: PACU  Anesthesia Type:General  Level of Consciousness: awake, alert , oriented and patient cooperative  Airway & Oxygen Therapy: Patient Spontanous Breathing and Patient connected to face mask oxygen  Post-op Assessment: Report given to RN and Post -op Vital signs reviewed and stable  Post vital signs: Reviewed and stable  Last Vitals:  Vitals Value Taken Time  BP 115/87 06/25/2017 10:45 AM  Temp    Pulse 46 06/25/2017 10:45 AM  Resp 18 06/25/2017 10:46 AM  SpO2 77 % 06/25/2017 10:45 AM  Vitals shown include unvalidated device data.  Last Pain:  Vitals:   06/25/17 0733  TempSrc:   PainSc: 9          Complications: No apparent anesthesia complications

## 2017-06-25 NOTE — H&P (Addendum)
Eugene Shelton is an 37 y.o. male.   Chief Complaint: perianal pain HPI: 37 yo male with 1 week of perianal pain. Initially he has been under the weather with a URI for the last 3 weeks. During this time he notes pain with coughing in his abdomen and anal areas. He now has a constant pain in his anal area and was concerned about hemorrhoids. He has subjective fevers in the last 2 days. He denies drainage. He has never had a skin infection or abscess before. He has no known immunocompromising diseases and notes poor nutrition in the last 3 weeks.  History reviewed. No pertinent past medical history.  History reviewed. No pertinent surgical history.  No family history on file. Social History:  reports that he has been smoking cigarettes.  He has been smoking about 0.50 packs per day. He has never used smokeless tobacco. He reports that he drinks alcohol. He reports that he has current or past drug history.  Allergies: No Known Allergies   (Not in a hospital admission)  Results for orders placed or performed during the hospital encounter of 06/25/17 (from the past 48 hour(s))  Comprehensive metabolic panel     Status: Abnormal   Collection Time: 06/24/17 10:34 PM  Result Value Ref Range   Sodium 130 (L) 135 - 145 mmol/L   Potassium 3.2 (L) 3.5 - 5.1 mmol/L   Chloride 94 (L) 101 - 111 mmol/L   CO2 24 22 - 32 mmol/L   Glucose, Bld 187 (H) 65 - 99 mg/dL   BUN <5 (L) 6 - 20 mg/dL   Creatinine, Ser 0.94 0.61 - 1.24 mg/dL   Calcium 8.4 (L) 8.9 - 10.3 mg/dL   Total Protein 7.1 6.5 - 8.1 g/dL   Albumin 2.7 (L) 3.5 - 5.0 g/dL   AST 25 15 - 41 U/L   ALT 15 (L) 17 - 63 U/L   Alkaline Phosphatase 93 38 - 126 U/L   Total Bilirubin 0.4 0.3 - 1.2 mg/dL   GFR calc non Af Amer >60 >60 mL/min   GFR calc Af Amer >60 >60 mL/min    Comment: (NOTE) The eGFR has been calculated using the CKD EPI equation. This calculation has not been validated in all clinical situations. eGFR's persistently <60  mL/min signify possible Chronic Kidney Disease.    Anion gap 12 5 - 15    Comment: Performed at [facility] [address], [place], [place] [address]  CBC     Status: Abnormal   Collection Time: 06/24/17 10:34 PM  Result Value Ref Range   WBC 15.2 (H) 4.0 - 10.5 K/uL   RBC 5.31 4.22 - 5.81 MIL/uL   Hemoglobin 13.8 13.0 - 17.0 g/dL   HCT 41.1 39.0 - 52.0 %   MCV 77.4 (L) 78.0 - 100.0 fL   MCH 26.0 26.0 - 34.0 pg   MCHC 33.6 30.0 - 36.0 g/dL   RDW 12.9 11.5 - 15.5 %   Platelets 302 150 - 400 K/uL    Comment: Performed at [facility], [place] [address] [place], [place] [address]  Urinalysis, Routine w reflex microscopic     Status: Abnormal   Collection Time: 06/24/17 10:42 PM  Result Value Ref Range   Color, Urine YELLOW YELLOW   APPearance CLEAR CLEAR   Specific Gravity, Urine 1.018 1.005 - 1.030   pH 6.0 5.0 - 8.0   Glucose, UA 150 (A) NEGATIVE mg/dL   Hgb urine dipstick  NEGATIVE NEGATIVE   Bilirubin Urine NEGATIVE NEGATIVE   Ketones, ur NEGATIVE NEGATIVE mg/dL   Protein, ur NEGATIVE NEGATIVE mg/dL   Nitrite NEGATIVE NEGATIVE   Leukocytes, UA NEGATIVE NEGATIVE    Comment: Performed at [facility] [address], [place], [place] [address]  POC occult blood, ED Provider will collect     Status: None   Collection Time: 06/25/17  2:45 AM  Result Value Ref Range   Fecal Occult Bld NEGATIVE NEGATIVE  I-Stat CG4 Lactic Acid, ED     Status: None   Collection Time: 06/25/17  3:03 AM  Result Value Ref Range   Lactic Acid, Venous 1.01 0.5 - 1.9 mmol/L   Ct Abdomen Pelvis W Contrast  Result Date: 06/25/2017 CLINICAL DATA:  37 year old male with perianal abscess. EXAM: CT ABDOMEN AND PELVIS WITH CONTRAST TECHNIQUE: Multidetector CT imaging of the abdomen and pelvis was performed using the standard protocol following bolus administration of intravenous contrast. CONTRAST:  140m ISOVUE-300 IOPAMIDOL (ISOVUE-300) INJECTION 61% COMPARISON:  Abdominal CT dated  11/03/2015 and testicular ultrasound dated 06/24/2017 FINDINGS: Lower chest: The visualized lung bases are clear. No intra-abdominal free air or free fluid. Hepatobiliary: No focal liver abnormality is seen. No gallstones, gallbladder wall thickening, or biliary dilatation. Pancreas: Unremarkable. No pancreatic ductal dilatation or surrounding inflammatory changes. Spleen: Normal in size without focal abnormality. Adrenals/Urinary Tract: Adrenal glands are unremarkable. Kidneys are normal, without renal calculi, focal lesion, or hydronephrosis. Bladder is unremarkable. Stomach/Bowel: There is a 7 mm ileo ileal intussusception in the left upper abdomen, likely transient. There is no evidence of bowel obstruction or active inflammation. Normal appendix. Vascular/Lymphatic: Mild aortoiliac atherosclerotic disease. The abdominal aorta and IVC are otherwise unremarkable. No portal venous gas. There is no adenopathy. Reproductive: The prostate and seminal vesicles are grossly unremarkable. No pelvic mass. Other: There is a 5 x 6 x 5 cm complex collection containing debris and gas with thick enhancing peripheral wall in the left and posterior perirectal soft tissues extending to the left perineum most consistent with an abscess. A fistulous tract extends from this collection to the left rectal wall (axial series 3 images 77 and 78 and coronal series 6 images 58-62). Musculoskeletal: No acute or significant osseous findings. IMPRESSION: 1. Perianal abscess with apparent fistulous tract to the lower rectum. Clinical correlation is recommended. 2. No acute intra-abdominopelvic pathology. Probable transient ileo ileal intussusception in the left upper abdomen. No bowel obstruction or active inflammation. Normal appendix. Electronically Signed   By: AAnner CreteM.D.   On: 06/25/2017 05:35   UKoreaScrotum W/doppler  Result Date: 06/25/2017 CLINICAL DATA:  37year old male with left testicular pain x1 week. EXAM: SCROTAL  ULTRASOUND DOPPLER ULTRASOUND OF THE TESTICLES TECHNIQUE: Complete ultrasound examination of the testicles, epididymis, and other scrotal structures was performed. Color and spectral Doppler ultrasound were also utilized to evaluate blood flow to the testicles. COMPARISON:  CT of the abdomen pelvis dated 11/03/2015 FINDINGS: Right testicle Measurements: 4.2 x 2.0 x 2.7 cm. Small scattered areas of microcalcification noted, likely sequela of prior insult. There is no associated mass. Left testicle Measurements: 3.6 x 1.6 x 2.7 cm. Small scattered areas of microcalcification noted, likely sequela of prior insult. There is no associated mass. Right epididymis:  Normal in size and appearance. Left epididymis:  Normal in size and appearance. Hydrocele:  None visualized. Varicocele:  Mild left varicocele. Pulsed Doppler interrogation of both testes demonstrates normal low resistance arterial and venous waveforms bilaterally. IMPRESSION: 1. No acute  findings. Doppler detected bilateral arterial and venous flow. 2. Small foci of calcifications without associated mass, likely sequela of prior infection/inflammation. Clinical correlation is recommended. 3. Mild left testicular varicocele. Electronically Signed   By: Anner Crete M.D.   On: 06/25/2017 00:07    Review of Systems  Constitutional: Negative for chills and fever.  HENT: Negative for hearing loss.   Eyes: Negative for blurred vision and double vision.  Respiratory: Positive for cough. Negative for hemoptysis.   Cardiovascular: Negative for chest pain and palpitations.  Gastrointestinal: Negative for abdominal pain, blood in stool, constipation, nausea and vomiting.  Genitourinary: Negative for dysuria and urgency.  Musculoskeletal: Negative for myalgias and neck pain.  Skin: Negative for itching and rash.  Neurological: Negative for dizziness, tingling and headaches.  Endo/Heme/Allergies: Does not bruise/bleed easily.  Psychiatric/Behavioral:  Negative for depression and suicidal ideas.    Blood pressure 123/80, pulse 79, temperature 98.7 F (37.1 C), temperature source Oral, resp. rate 18, height _0  (1.778 m), weight 70.3 kg (155 lb), SpO2 97 %. Physical Exam  Vitals reviewed. Constitutional: He is oriented to person, place, and time. He appears well-developed and well-nourished.  HENT:  Head: Normocephalic and atraumatic.  Eyes: Pupils are equal, round, and reactive to light. Conjunctivae and EOM are normal.  Neck: Normal range of motion. Neck supple.  Cardiovascular: Normal rate and regular rhythm.  Respiratory: Effort normal and breath sounds normal.  GI: Soft. Bowel sounds are normal. He exhibits no distension. There is no tenderness.  Genitourinary:  Genitourinary Comments: Induration and fluctuance on the posterior and right sides of the anal verge, digital exam exquisitely tender  Musculoskeletal: Normal range of motion.  Neurological: He is alert and oriented to person, place, and time.  Skin: Skin is warm and dry.  Psychiatric: He has a normal mood and affect. His behavior is normal.     Assessment/Plan 37 yo male with perirectal abscess which appears to go beyond the dentate and is a horseshoe type -antibiotics -admit to hospital -incision and drainage today -HIV screen -we discussed the etiology perirectal abscesses, that they are best cared for by drainage, they require open incisions with wound care and often sitz baths. We discussed the possibility of a fistula being present now or developing one in the future. He showed understanding and wanted to proceed.  Mickeal Skinner, MD 06/25/2017, 6:30 AM

## 2017-06-25 NOTE — ED Notes (Signed)
Surgeon in room at this time.

## 2017-06-26 ENCOUNTER — Encounter (HOSPITAL_COMMUNITY): Payer: Self-pay | Admitting: General Surgery

## 2017-06-26 LAB — CBC
HCT: 36.6 % — ABNORMAL LOW (ref 39.0–52.0)
Hemoglobin: 12.1 g/dL — ABNORMAL LOW (ref 13.0–17.0)
MCH: 25.4 pg — ABNORMAL LOW (ref 26.0–34.0)
MCHC: 33.1 g/dL (ref 30.0–36.0)
MCV: 76.7 fL — AB (ref 78.0–100.0)
Platelets: 313 10*3/uL (ref 150–400)
RBC: 4.77 MIL/uL (ref 4.22–5.81)
RDW: 13.5 % (ref 11.5–15.5)
WBC: 16.8 10*3/uL — AB (ref 4.0–10.5)

## 2017-06-26 LAB — COMPREHENSIVE METABOLIC PANEL
ALT: 13 U/L — ABNORMAL LOW (ref 17–63)
AST: 22 U/L (ref 15–41)
Albumin: 2.3 g/dL — ABNORMAL LOW (ref 3.5–5.0)
Alkaline Phosphatase: 75 U/L (ref 38–126)
Anion gap: 13 (ref 5–15)
BILIRUBIN TOTAL: 0.6 mg/dL (ref 0.3–1.2)
BUN: <5 mg/dL — ABNORMAL LOW (ref 6–20)
CHLORIDE: 98 mmol/L — AB (ref 101–111)
CO2: 24 mmol/L (ref 22–32)
Calcium: 8.4 mg/dL — ABNORMAL LOW (ref 8.9–10.3)
Creatinine, Ser: 0.79 mg/dL (ref 0.61–1.24)
Glucose, Bld: 110 mg/dL — ABNORMAL HIGH (ref 65–99)
Potassium: 3.7 mmol/L (ref 3.5–5.1)
Sodium: 135 mmol/L (ref 135–145)
TOTAL PROTEIN: 6.3 g/dL — AB (ref 6.5–8.1)

## 2017-06-26 LAB — GLUCOSE, CAPILLARY
GLUCOSE-CAPILLARY: 105 mg/dL — AB (ref 65–99)
GLUCOSE-CAPILLARY: 64 mg/dL — AB (ref 65–99)
GLUCOSE-CAPILLARY: 85 mg/dL (ref 65–99)
Glucose-Capillary: 89 mg/dL (ref 65–99)
Glucose-Capillary: 108 mg/dL — ABNORMAL HIGH (ref 65–99)

## 2017-06-26 MED ORDER — MORPHINE SULFATE (PF) 4 MG/ML IV SOLN: 1.0000 mg | INTRAVENOUS | Status: DC | PRN

## 2017-06-26 MED ORDER — LIVING WELL WITH DIABETES BOOK
Freq: Once | Status: DC
  Filled 2017-06-26: qty 1

## 2017-06-26 MED ORDER — OXYCODONE HCL 5 MG PO TABS
5.0000 mg | ORAL_TABLET | ORAL | Status: DC | PRN
  Administered 2017-06-26 (×2): 5 mg via ORAL
  Filled 2017-06-26 (×2): qty 1

## 2017-06-26 NOTE — Progress Notes (Signed)
Inpatient Diabetes Program Recommendations  AACE/ADA: New Consensus Statement on Inpatient Glycemic Control (2015)  Target Ranges:  Prepandial:   less than 140 mg/dL      Peak postprandial:   less than 180 mg/dL (1-2 hours)      Critically ill patients:  140 - 180 mg/dL   Lab Results  Component Value Date   GLUCAP 105 (H) 06/26/2017   HGBA1C 5.9 (H) 06/24/2017    Review of Glycemic Control Results for Eugene Shelton, Eugene Shelton (MRN [id number]) as of 06/26/2017 14:28  Ref. Range 06/25/2017 16:32 06/25/2017 21:42 06/26/2017 07:35 06/26/2017 12:09  Glucose-Capillary Latest Ref Range: 65 - 99 mg/dL 409166 (H) 811100 (H) 89 914105 (H)   Diabetes history: Prediabetes Outpatient Diabetes medications: none Current orders for Inpatient glycemic control: Novolog 0-15 units TID  Inpatient Diabetes Program Recommendations:     Responded to consult placed by MD for patient with A1C of 5.9%. According to ADA this places patient in the "prediabetes" category. Reviewed patient's current A1c. Explained what a A1c is and what it measures. Also reviewed goal A1c with patient, importance of good glucose control @ home, and blood sugar goals.  Discussed the long term implications of diabetes, the pathophysiology behind diabetes, beta cell function, vascular changes and how insulin responds to sugar. Co morbidities were also discussed in the event of lack of management or poor control; putting patients at higher risks for infections, MI, CVAs and amputations. Education also provided on the impact of early recognition and changes to lifestyle such as diet and exercise.  Patient states that he has been sick the last month and has not been eating as well and drinking sodas. He is usually very active. Patient states, "I usually work up to 80 hours a week, until just recently." We discussed better choices and eliminating sugary beverages and to check BS when eating sugary foods to learn how body responds. Discussed normal BS values and  what to expect.  Encouraged to purchase a [employer] meter at [facility] and begin checking blood sugars occasionally throughout the week. Educated on frequency of checking BS, writing them down and taking diary to PCP.  Patient very receptive to the information and appreciates the discussion. Plans to purchase meter and make some lifestyle modifications once he starts feeling better. Will place order for case management, as he will need a PCP for follow up care.  Thanks, Lujean RaveLauren Maui Britten, MSN, RNC-OB Diabetes Coordinator [phone] (8a-5p)

## 2017-06-26 NOTE — Progress Notes (Signed)
[facility] Progress Note  1 Day Post-Op  Subjective: CC- sore Doing well. States that he is sore but feeling better than yesterday. Tolerating diet. Denies n/v. WBC still elevated at 16.8, TMAX 99.3.  Works at two [employer].   Objective: Vital signs in last 24 hours: Temp:  [98 F (36.7 C)-99.3 F (37.4 C)] 98.6 F (37 C) (04/03 0624) Pulse Rate:  [46-114] 72 (04/03 0624) Resp:  [12-29] 17 (04/03 0624) BP: (105-124)/(67-88) 105/67 (04/03 0624) SpO2:  [93 %-100 %] 99 % (04/03 0624) Last BM Date: 06/23/17  Intake/Output from previous day: 04/02 0701 - 04/03 0700 In: 1653.8 [P.O.:600; I.V.:953.8; IV Piggyback:100] Out: 975 [Urine:900; Blood:75] Intake/Output this shift: No intake/output data recorded.  PE: Gen:  Alert, NAD, pleasant HEENT: EOM's intact, pupils equal and round Card:  RRR, no M/G/R heard Pulm:  effort normal Abd: Soft, NT/ND, +BS Psych: A&Ox3  GU: perirectal abscess s/p I&D with penrose drain in place, no erythema or fluctuance, trace serosanguinous/slightly cloudy drainage from around penrose  Lab Results:  Recent Labs    06/25/17 1148 06/26/17 0416  WBC 17.8* 16.8*  HGB 12.6* 12.1*  HCT 38.7* 36.6*  PLT 288 313   BMET Recent Labs    06/24/17 2234 06/25/17 1148 06/26/17 0416  NA 130*  --  135  K 3.2*  --  3.7  CL 94*  --  98*  CO2 24  --  24  GLUCOSE 187*  --  110*  BUN <5*  --  <5*  CREATININE 0.94 0.80 0.79  CALCIUM 8.4*  --  8.4*   PT/INR No results for input(s): LABPROT, INR in the last 72 hours. CMP     Component Value Date/Time   NA 135 06/26/2017 0416   K 3.7 06/26/2017 0416   CL 98 (L) 06/26/2017 0416   CO2 24 06/26/2017 0416   GLUCOSE 110 (H) 06/26/2017 0416   BUN <5 (L) 06/26/2017 0416   CREATININE 0.79 06/26/2017 0416   CALCIUM 8.4 (L) 06/26/2017 0416   PROT 6.3 (L) 06/26/2017 0416   ALBUMIN 2.3 (L) 06/26/2017 0416   AST 22 06/26/2017 0416   ALT 13 (L) 06/26/2017 0416   ALKPHOS 75  06/26/2017 0416   BILITOT 0.6 06/26/2017 0416   GFRNONAA >60 06/26/2017 0416   GFRAA >60 06/26/2017 0416   Lipase     Component Value Date/Time   LIPASE 20 11/03/2015 1521       Studies/Results: Ct Abdomen Pelvis W Contrast  Result Date: 06/25/2017 CLINICAL DATA:  37 year old male with perianal abscess. EXAM: CT ABDOMEN AND PELVIS WITH CONTRAST TECHNIQUE: Multidetector CT imaging of the abdomen and pelvis was performed using the standard protocol following bolus administration of intravenous contrast. CONTRAST:  ISOVUE-300 IOPAMIDOL (ISOVUE-300) INJECTION 61% COMPARISON:  Abdominal CT dated 11/03/2015 and testicular ultrasound dated 06/24/2017 FINDINGS: Lower chest: The visualized lung bases are clear. No intra-abdominal free air or free fluid. Hepatobiliary: No focal liver abnormality is seen. No gallstones, gallbladder wall thickening, or biliary dilatation. Pancreas: Unremarkable. No pancreatic ductal dilatation or surrounding inflammatory changes. Spleen: Normal in size without focal abnormality. Adrenals/Urinary Tract: Adrenal glands are unremarkable. Kidneys are normal, without renal calculi, focal lesion, or hydronephrosis. Bladder is unremarkable. Stomach/Bowel: There is a 7 mm ileo ileal intussusception in the left upper abdomen, likely transient. There is no evidence of bowel obstruction or active inflammation. Normal appendix. Vascular/Lymphatic: Mild aortoiliac atherosclerotic disease. The abdominal aorta and IVC are otherwise unremarkable. No portal venous gas. There is  no adenopathy. Reproductive: The prostate and seminal vesicles are grossly unremarkable. No pelvic mass. Other: There is a 5 x 6 x 5 cm complex collection containing debris and gas with thick enhancing peripheral wall in the left and posterior perirectal soft tissues extending to the left perineum most consistent with an abscess. A fistulous tract extends from this collection to the left rectal wall (axial series 3  images 77 and 78 and coronal series 6 images 58-62). Musculoskeletal: No acute or significant osseous findings. IMPRESSION: 1. Perianal abscess with apparent fistulous tract to the lower rectum. Clinical correlation is recommended. 2. No acute intra-abdominopelvic pathology. Probable transient ileo ileal intussusception in the left upper abdomen. No bowel obstruction or active inflammation. Normal appendix. Electronically Signed   By: Elgie CollardArash  Radparvar M.D.   On: 06/25/2017 05:35   [place] Scrotum W/doppler  Result Date: 06/25/2017 CLINICAL DATA:  37 year old male with left testicular pain x1 week. EXAM: SCROTAL ULTRASOUND DOPPLER ULTRASOUND OF THE TESTICLES TECHNIQUE: Complete ultrasound examination of the testicles, epididymis, and other scrotal structures was performed. Color and spectral Doppler ultrasound were also utilized to evaluate blood flow to the testicles. COMPARISON:  CT of the abdomen pelvis dated 11/03/2015 FINDINGS: Right testicle Measurements: 4.2 x 2.0 x 2.7 cm. Small scattered areas of microcalcification noted, likely sequela of prior insult. There is no associated mass. Left testicle Measurements: 3.6 x 1.6 x 2.7 cm. Small scattered areas of microcalcification noted, likely sequela of prior insult. There is no associated mass. Right epididymis:  Normal in size and appearance. Left epididymis:  Normal in size and appearance. Hydrocele:  None visualized. Varicocele:  Mild left varicocele. Pulsed Doppler interrogation of both testes demonstrates normal low resistance arterial and venous waveforms bilaterally. IMPRESSION: 1. No acute findings. Doppler detected bilateral arterial and venous flow. 2. Small foci of calcifications without associated mass, likely sequela of prior infection/inflammation. Clinical correlation is recommended. 3. Mild left testicular varicocele. Electronically Signed   By: Elgie CollardArash  Radparvar M.D.   On: 06/25/2017 00:07    Anti-infectives: Anti-infectives (From admission,  onward)   Start     Dose/Rate Route Frequency Ordered Stop   06/25/17 1800  vancomycin (VANCOCIN) 1,500 mg in sodium chloride 0.9 % 500 mL IVPB  Status:  Discontinued     1,500 mg 250 mL/hr over 120 Minutes Intravenous Every 12 hours 06/25/17 0808 06/25/17 1143   06/25/17 1400  piperacillin-tazobactam (ZOSYN) IVPB 3.375 g     3.375 g 12.5 mL/hr over 240 Minutes Intravenous Every 8 hours 06/25/17 0808     06/25/17 0700  cefTRIAXone (ROCEPHIN) 2 g in sodium chloride 0.9 % 100 mL IVPB  Status:  Discontinued     2 g 200 mL/hr over 30 Minutes Intravenous Daily 06/25/17 0614 06/25/17 0639   06/25/17 0630  piperacillin-tazobactam (ZOSYN) IVPB 3.375 g     3.375 g 100 mL/hr over 30 Minutes Intravenous  Once 06/25/17 0617 06/25/17 0812   06/25/17 0630  vancomycin (VANCOCIN) IVPB 1000 mg/200 mL premix  Status:  Discontinued     1,000 mg 200 mL/hr over 60 Minutes Intravenous  Once 06/25/17 0617 06/25/17 1128   06/25/17 0630  clindamycin (CLEOCIN) IVPB 600 mg  Status:  Discontinued     600 mg 100 mL/hr over 30 Minutes Intravenous  Once 06/25/17 0619 06/25/17 0619   06/25/17 0630  clindamycin (CLEOCIN) IVPB 900 mg     900 mg 100 mL/hr over 30 Minutes Intravenous  Once 06/25/17 0619 06/25/17 [id number]  Assessment/Plan Pre-DM - A1c 5.9, diabetes coordinator eval pending  Horseshoe perirectal abscess S/p incision and drainage perirectal abscess 4/2 Dr. Dwain Sarna - POD 1 - intraoperative cultures pending - penrose in place, draining appropriately - WBC 16.8, TMAX 99.3  ID - zosyn 4/2>>day#2 FEN - decrease IVF, carb mod diet VTE - SCDs, lovenox Foley - none Follow up - 1 week with Dr. Dwain Sarna  Plan - Keep packing in place today, plan to remove tomorrow. Continue penrose drain. Continue IV abx and repeat CBC in AM.   LOS: 1 day    Franne Forts , [facility] 06/26/2017, 8:27 AM Pager: [phone] Consults: [phone] Mon-Fri 7:00 am-4:30 pm Sat-Sun 7:00  am-11:30 am

## 2017-06-26 NOTE — Progress Notes (Signed)
ANTIBIOTIC CONSULT NOTE  Pharmacy Consult for Zosyn Indication: perirectal abscess  No Known Allergies  Patient Measurements: Height: 5\' 10"  (177.8 cm) Weight: 155 lb (70.3 kg) IBW/kg (Calculated) : 73 Adjusted Body Weight:    Vital Signs: Temp: 98.6 F (37 C) (04/03 0624) Temp Source: Oral (04/03 0624) BP: 105/67 (04/03 0624) Pulse Rate: 72 (04/03 0624) Intake/Output from previous day: 04/02 0701 - 04/03 0700 In: 1653.8 [P.O.:600; I.V.:953.8; IV Piggyback:100] Out: 975 [Urine:900; Blood:75] Intake/Output from this shift: Total I/O In: 300 [P.O.:300] Out: -   Labs: Recent Labs    06/24/17 2234 06/25/17 1148 06/26/17 0416  WBC 15.2* 17.8* 16.8*  HGB 13.8 12.6* 12.1*  PLT 302 288 313  CREATININE 0.94 0.80 0.79   Estimated Creatinine Clearance: 128.2 mL/min (by C-G formula based on SCr of 0.79 mg/dL). No results for input(s): VANCOTROUGH, VANCOPEAK, VANCORANDOM, GENTTROUGH, GENTPEAK, GENTRANDOM, TOBRATROUGH, TOBRAPEAK, TOBRARND, AMIKACINPEAK, AMIKACINTROU, AMIKACIN in the last 72 hours.   Microbiology:   Medical History: Past Medical History:  Diagnosis Date  . Medical history non-contributory     Assessment: ID: Patient with perirectal abscess- s/p I&D on 4/2. Has a drain. WBC 16.8, Tmax 99.3.CrCl >100  Vancomycin 4/2 >> 4/2 Zosyn 4/2 >>  Clindamycin 4/2 x1  4/2 BCx>>pending 4/2: abscess cxs: GNR, GPC pairs reincubated  Goal of Therapy:  Eradication of infection  Plan:  Zosyn 3.375g IV q 8 hrs. Pharmacy will sign off. Please reconsult for further dosing assitance.   Adisen Bennion S. Merilynn Finlandobertson, PharmD, BCPS Clinical Staff Pharmacist Pager [phone]  Misty Stanleyobertson, Ahuva Poynor Stillinger 06/26/2017,1:08 PM

## 2017-06-27 LAB — BASIC METABOLIC PANEL
ANION GAP: 10 (ref 5–15)
BUN: 6 mg/dL (ref 6–20)
CO2: 22 mmol/L (ref 22–32)
Calcium: 8.6 mg/dL — ABNORMAL LOW (ref 8.9–10.3)
Chloride: 104 mmol/L (ref 101–111)
Creatinine, Ser: 0.83 mg/dL (ref 0.61–1.24)
GFR calc Af Amer: >60 mL/min (ref >60)
GFR calc non Af Amer: >60 mL/min (ref >60)
GLUCOSE: 83 mg/dL (ref 65–99)
Potassium: 4.1 mmol/L (ref 3.5–5.1)
Sodium: 136 mmol/L (ref 135–145)

## 2017-06-27 LAB — GLUCOSE, CAPILLARY: Glucose-Capillary: 77 mg/dL (ref 65–99)

## 2017-06-27 LAB — CBC
HEMATOCRIT: 38.2 % — AB (ref 39.0–52.0)
Hemoglobin: 12.4 g/dL — ABNORMAL LOW (ref 13.0–17.0)
MCH: 24.8 pg — AB (ref 26.0–34.0)
MCHC: 32.5 g/dL (ref 30.0–36.0)
MCV: 76.6 fL — AB (ref 78.0–100.0)
Platelets: 388 10*3/uL (ref 150–400)
RBC: 4.99 MIL/uL (ref 4.22–5.81)
RDW: 13.5 % (ref 11.5–15.5)
WBC: 9.7 10*3/uL (ref 4.0–10.5)

## 2017-06-27 MED ORDER — LIVING WELL WITH DIABETES BOOK: 1.0000 | Freq: Once | Status: AC

## 2017-06-27 MED ORDER — OXYCODONE HCL 5 MG PO TABS: 5.0000 mg | ORAL_TABLET | Freq: Four times a day (QID) | ORAL | 0 refills | Status: DC | PRN

## 2017-06-27 MED ORDER — RELION CONFIRM GLUCOSE MONITOR W/DEVICE KIT: 1.0000 [IU] | PACK | 0 refills | Status: AC | PRN

## 2017-06-27 MED ORDER — AMOXICILLIN-POT CLAVULANATE 875-125 MG PO TABS: 1.0000 | ORAL_TABLET | Freq: Two times a day (BID) | ORAL | 0 refills | Status: AC

## 2017-06-27 NOTE — Discharge Summary (Signed)
  [facility] Discharge Summary   Patient ID: Eugene Shelton MRN: [id number] DOB/AGE: 1980/05/27 37 y.o.  Admit date: 06/25/2017 Discharge date: 06/27/2017  Admitting Diagnosis: Perirectal abscess  Discharge Diagnosis Patient Active Problem List   Diagnosis Date Noted  . Perirectal abscess 06/25/2017    Consultants None  Imaging: No results found.  Procedures Dr. Donne Hazel (06/25/17) - incision and drainage perirectal abscess   Hospital Course:  Eugene Shelton is a 37yo male with no known PMH who presented to [facility] 4/2 with 1 week of perianal pain.  Workup showed perirectal abscess. He was also found to be pre-diabetic; diabetes coordinator consulted and patient given PCP information at discharge.  Patient was admitted and underwent procedure listed above.  Tolerated procedure well and was transferred to the floor.  He was kept in IV antibiotics for 48 hours. WBC trended down and vital signs stabilized. Packing was removed on POD2 and patient started on sitz baths. On POD2 the patient was voiding well, tolerating diet, ambulating well, pain well controlled, vital signs stable, incisions c/d/i and felt stable for discharge home.  Patient will follow up in our office in 1 week and knows to call with questions or concerns.   I have personally reviewed the patients medication history on the  controlled substance database.    Physical Exam: Gen:  Alert, NAD, pleasant HEENT: EOM's intact, pupils equal and round Card:  RRR, no M/G/R heard Pulm:  effort normal Abd: Soft, NT/ND, +BS Psych: A&Ox3  GU: perirectal abscess s/p I&D with penrose drain in place, no erythema or fluctuance, trace serosanguinous/slightly cloudy drainage from around penrose >> packing removed  Allergies as of 06/27/2017   No Known Allergies     Medication List    STOP taking these medications   naproxen sodium 220 MG tablet Commonly known as:  ALEVE     TAKE these medications    acetaminophen 500 MG tablet Commonly known as:  TYLENOL Take 1,000 mg by mouth every 6 (six) hours as needed for mild pain.   amoxicillin-clavulanate 875-125 MG tablet Commonly known as:  AUGMENTIN Take 1 tablet by mouth 2 (two) times daily for 5 days.   ibuprofen 200 MG tablet Commonly known as:  ADVIL,MOTRIN Take 400 mg by mouth every 6 (six) hours as needed for moderate pain.   living well with diabetes book Misc 1 each by Does not apply route once for 1 dose.   oxyCODONE 5 MG immediate release tablet Commonly known as:  Oxy IR/ROXICODONE Take 1 tablet (5 mg total) by mouth every 6 (six) hours as needed for severe pain.   RELION CONFIRM GLUCOSE MONITOR w/Device Kit 1 Units by Does not apply route as needed. To check glucose periodically throughout the day. Keep a log.        Follow-up Information    Rolm Bookbinder, MD. Go on 07/05/2017.   Specialty:  General Surgery Why:  Your appointment is 07/05/17 at 11:00AM. Please arrive 30 minutes prior to your appointment to check in and fill out paperwork. Bring photo ID and insurance information. Contact information: [address] [place] [address] [phone]           Signed: Wellington Hampshire, [facility] 06/27/2017, 9:27 AM Pager: [phone] Consults: [phone] Mon-Fri 7:00 am-4:30 pm Sat-Sun 7:00 am-11:30 am

## 2017-06-27 NOTE — Progress Notes (Signed)
Discharged home today, his brother driving him home. Discharged instructions, prescriptions for medications, personal belongings given to patient. Verbalized understanding of instructions.

## 2017-06-27 NOTE — Care Management Note (Signed)
Case Management Note  Patient Details  Name: Eugene DacostaDevin Vernell Kinchen MRN: [id number] Date of Birth: 16-Dec-1980  Subjective/Objective:                    Action/Plan:  Provided and explained  MATCH letter to patient.   [facility] . Patient given appointment information and contact information.  Patient voiced understanding to all of above.  [facility] machine to check his blood sugar. Expected Discharge Date:  06/27/17               Expected Discharge Plan:  Home/Self Care  In-House Referral:  Financial Counselor  Discharge planning Services  CM Consult, [facility], Medication Assistance  Post Acute Care Choice:  NA Choice offered to:  Patient  DME Arranged:  N/A DME Agency:  NA  HH Arranged:  NA HH Agency:  NA  Status of Service:  Completed, signed off  If discussed at Long Length of Stay Meetings, dates discussed:    Additional Comments:  Kingsley PlanWile, [place] Marie, RN 06/27/2017, 10:11 AM

## 2017-06-27 NOTE — Discharge Instructions (Signed)
Disposable Sitz Bath °A disposable sitz bath is a plastic basin that fits over the toilet. A bag is hung above the toilet, and the bag is connected to a tube that opens into the basin. The bag is filled with warm water that flows into the basin through the tube. A sitz bath can be used to help relieve symptoms, clean, and promote healing in the genital and anal areas, as well as in the lower abdomen and buttocks. °What are the risks? °Sitz baths are generally very safe. It is possible for the skin between the genitals and the anus (perineum) to become infected, but this is rare. You can avoid this by cleaning your sitz bath supplies thoroughly. °How to use a disposable sitz bath °1. Close the clamp on the tube. Make sure the clamp is closed tightly to prevent leakage. °2. Fill the sitz bath basin and the plastic bag with warm water. The water should be warm enough to be comfortable, but not hot. °3. Raise the toilet seat and place the filled basin on the toilet. Make sure the overflow opening is facing toward the back of the toilet. °? If you prefer, you may place the empty basin on the toilet first, and then use the plastic bag to fill the basin with warm water. °4. Hang the filled plastic bag overhead on a hook or towel rack close to the toilet. The bag should be higher than the toilet so that the water will flow down through the tube. °5. Attach the tube to the opening on the basin. Make sure that the tube is attached to the basin tightly to prevent leakage. °6. Sit on the basin and release the clamp. This will allow warm water to flow into the basin and flush the area around your genitals and anus. °7. Remain sitting on the basin for about 15-20 minutes, or as long as told by your health care provider. °8. Stand up and gently pat your skin dry. If directed, apply clean bandages (dressings) to the affected area as told by your health care provider. °9. Carefully remove the basin from the toilet seat and tip the  basin into the toilet to empty any remaining water. Empty any remaining water from the plastic bag into the toilet. Then, flush the toilet. °10. Wash the basin with warm water and soap. Let the basin air dry in the sink. You should also let the plastic bag and the tubing air dry. °11. Store the basin, tubing, and plastic bag in a clean, dry area. °12. Wash your hands with soap and water. If soap and water are not available, use hand sanitizer. °Contact a health care provider if: °· You have symptoms that get worse instead of better. °· You develop new skin irritation, redness, or swelling around your genitals or anus. °This information is not intended to replace advice given to you by your health care provider. Make sure you discuss any questions you have with your health care provider. °Document Released: 09/11/2011 Document Revised: 08/18/2015 Document Reviewed: 01/30/2015 °Elsevier Interactive Patient Education © 2018 Elsevier Inc. ° °

## 2017-06-30 LAB — CULTURE, BLOOD (ROUTINE X 2)
Culture: NO GROWTH
Culture: NO GROWTH
SPECIAL REQUESTS: ADEQUATE
Special Requests: ADEQUATE

## 2017-06-30 LAB — AEROBIC/ANAEROBIC CULTURE (SURGICAL/DEEP WOUND)

## 2017-06-30 LAB — AEROBIC/ANAEROBIC CULTURE W GRAM STAIN (SURGICAL/DEEP WOUND)

## 2017-07-19 ENCOUNTER — Ambulatory Visit: Payer: Self-pay | Admitting: Internal Medicine

## 2018-01-30 ENCOUNTER — Emergency Department (HOSPITAL_COMMUNITY): Payer: Self-pay

## 2018-01-30 ENCOUNTER — Emergency Department (HOSPITAL_COMMUNITY)
Admission: EM | Admit: 2018-01-30 | Discharge: 2018-01-31 | Disposition: A | Discharge status: Home / Self Care | Payer: Self-pay | Attending: Emergency Medicine | Admitting: Emergency Medicine

## 2018-01-30 ENCOUNTER — Encounter (HOSPITAL_COMMUNITY): Payer: Self-pay | Admitting: Radiology

## 2018-01-30 DIAGNOSIS — Z5189 Encounter for other specified aftercare: Secondary | ICD-10-CM

## 2018-01-30 DIAGNOSIS — Z8719 Personal history of other diseases of the digestive system: Secondary | ICD-10-CM | POA: Insufficient documentation

## 2018-01-30 DIAGNOSIS — K529 Noninfective gastroenteritis and colitis, unspecified: Secondary | ICD-10-CM | POA: Insufficient documentation

## 2018-01-30 LAB — CBC WITH DIFFERENTIAL/PLATELET
Abs Immature Granulocytes: 0.02 10*3/uL (ref 0.00–0.07)
Basophils Absolute: 0 10*3/uL (ref 0.0–0.1)
Basophils Relative: 0 %
Eosinophils Absolute: 0.1 10*3/uL (ref 0.0–0.5)
Eosinophils Relative: 1 %
HCT: 43.1 % (ref 39.0–52.0)
Hemoglobin: 13.8 g/dL (ref 13.0–17.0)
Immature Granulocytes: 0 %
Lymphocytes Relative: 25 %
Lymphs Abs: 1.8 10*3/uL (ref 0.7–4.0)
MCH: 24.6 pg — ABNORMAL LOW (ref 26.0–34.0)
MCHC: 32 g/dL (ref 30.0–36.0)
MCV: 76.8 fL — ABNORMAL LOW (ref 80.0–100.0)
Monocytes Absolute: 0.8 10*3/uL (ref 0.1–1.0)
Monocytes Relative: 12 %
Neutro Abs: 4.3 10*3/uL (ref 1.7–7.7)
Neutrophils Relative %: 62 %
Platelets: 210 10*3/uL (ref 150–400)
RBC: 5.61 MIL/uL (ref 4.22–5.81)
RDW: 13.4 % (ref 11.5–15.5)
WBC: 7 10*3/uL (ref 4.0–10.5)
nRBC: 0 % (ref 0.0–0.2)

## 2018-01-30 LAB — BASIC METABOLIC PANEL
Anion gap: 9 (ref 5–15)
BUN: 5 mg/dL — ABNORMAL LOW (ref 6–20)
CO2: 23 mmol/L (ref 22–32)
Calcium: 8.9 mg/dL (ref 8.9–10.3)
Chloride: 104 mmol/L (ref 98–111)
Creatinine, Ser: 0.87 mg/dL (ref 0.61–1.24)
GFR calc Af Amer: >60 mL/min (ref >60)
GFR calc non Af Amer: >60 mL/min (ref >60)
Glucose, Bld: 78 mg/dL (ref 70–99)
Potassium: 3.4 mmol/L — ABNORMAL LOW (ref 3.5–5.1)
Sodium: 136 mmol/L (ref 135–145)

## 2018-01-30 LAB — I-STAT TROPONIN, ED: Troponin i, poc: 0 ng/mL (ref 0.00–0.08)

## 2018-01-30 MED ORDER — IOHEXOL 300 MG/ML  SOLN
100.0000 mL | Freq: Once | INTRAMUSCULAR | Status: AC | PRN
  Administered 2018-01-30: 100 mL via INTRAVENOUS

## 2018-01-30 MED ORDER — DOCUSATE SODIUM 100 MG PO CAPS: 100.0000 mg | ORAL_CAPSULE | Freq: Two times a day (BID) | ORAL | 0 refills | Status: AC

## 2018-01-30 MED ORDER — AMOXICILLIN-POT CLAVULANATE 875-125 MG PO TABS: 1.0000 | ORAL_TABLET | Freq: Two times a day (BID) | ORAL | 0 refills | Status: AC

## 2018-01-30 MED ORDER — AMOXICILLIN-POT CLAVULANATE 875-125 MG PO TABS
1.0000 | ORAL_TABLET | Freq: Once | ORAL | Status: AC
  Administered 2018-01-31: 1 via ORAL
  Filled 2018-01-30: qty 1

## 2018-01-30 MED ORDER — HYDROCODONE-ACETAMINOPHEN 5-325 MG PO TABS: 1.0000 | ORAL_TABLET | Freq: Four times a day (QID) | ORAL | 0 refills | Status: DC | PRN

## 2018-01-30 NOTE — ED Notes (Signed)
ED Provider at bedside. 

## 2018-01-30 NOTE — Discharge Instructions (Addendum)
1.  Call [facility] to schedule a recheck within the next 2 to 4 days. 2.  Take Augmentin twice daily as prescribed.  You may take Vicodin 1 to 2 tablets every 6 hours if needed for pain.  If you have any tendency towards constipation at all take Colace twice daily to keep her stool soft.  If you have not had soft stool, start taking MiraLAX which you can buy at the pharmacy. 3.  Return to the emergency department if you develop fever or worsening pain.

## 2018-01-30 NOTE — ED Notes (Signed)
IV attempted x2

## 2018-01-30 NOTE — ED Triage Notes (Signed)
Pt arrived via POV c/o pain to the buttock area where pt recently has 2 cysts drained. Pt states the site is painful and tingling and hurts far worse when having a BM.

## 2018-01-30 NOTE — ED Provider Notes (Signed)
[facility] EMERGENCY DEPARTMENT Provider Note   CSN: [id number] Arrival date & time: 01/30/18  [place]   History   Chief Complaint Chief Complaint  Patient presents with  . Wound Check    HPI Eugene Shelton is a 37 y.o. male.  HPI   35-year male presents today for wound check.  Patient notes on the second 2019 he had a perianal abscess was drained.  Per chart review it appears patient had a peri-anal abscess that tracked to the opposite side.  He had I&D under anesthesia with Penrose drain placed.  The drain was removed, patient improved and was discharged with instructions to follow-up as an outpatient.  Patient notes that he has been unable to follow-up as an outpatient.  Since the time of hospital discharge he has had drainage from the incisional wounds.  He notes the wound on the right occasionally swells and causes discomfort.  He denies any fever chills nausea or vomiting, denies any abdominal pain.   Past Medical History:  Diagnosis Date  . Medical history non-contributory     Patient Active Problem List   Diagnosis Date Noted  . Perirectal abscess 06/25/2017    Past Surgical History:  Procedure Laterality Date  . ABSCESS DRAINAGE  06/25/2017   UNDER ANESTHESIA (N/A Rectum)  . INCISION AND DRAINAGE PERIRECTAL ABSCESS N/A 06/25/2017   Procedure: IRRIGATION AND DEBRIDEMENT PERIRECTAL ABSCESS;  Surgeon: Rolm Bookbinder, MD;  Location: [facility];  Service: General;  Laterality: N/A;        Home Medications    Prior to Admission medications   Medication Sig Start Date End Date Taking? Authorizing Provider  acetaminophen (TYLENOL) 500 MG tablet Take 1,000 mg by mouth every 6 (six) hours as needed for mild pain.   Yes [provider]  ibuprofen (ADVIL,MOTRIN) 200 MG tablet Take 400 mg by mouth every 6 (six) hours as needed for moderate pain.   Yes [provider]  Blood Glucose Monitoring Suppl ([place])  w/Device KIT 1 Units by Does not apply route as needed. To check glucose periodically throughout the day. Keep a log. Patient not taking: Reported on 01/30/2018 06/27/17   Margie Billet A, PA-C  oxyCODONE (OXY IR/ROXICODONE) 5 MG immediate release tablet Take 1 tablet (5 mg total) by mouth every 6 (six) hours as needed for severe pain. Patient not taking: Reported on 01/30/2018 06/27/17   Wellington Hampshire, PA-C    Family History No family history on file.  Social History Social History   Tobacco Use  . Smoking status: Current Every Day Smoker    Packs/day: 0.50    Years: 15.00    Pack years: 7.50    Types: Cigarettes  . Smokeless tobacco: Never Used  Substance Use Topics  . Alcohol use: Yes    Comment: Socially  . Drug use: Not Currently     Allergies   Patient has no known allergies.   Review of Systems Review of Systems  All other systems reviewed and are negative.  Physical Exam Updated Vital Signs BP 109/76   Pulse 91   Temp 99.2 F (37.3 C) (Oral)   Resp 16   SpO2 97%   Physical Exam  Constitutional: He is oriented to person, place, and time. He appears well-developed and well-nourished.  HENT:  Head: Normocephalic and atraumatic.  Eyes: Pupils are equal, round, and reactive to light. Conjunctivae are normal. Right eye exhibits no discharge. Left eye exhibits no discharge. No scleral icterus.  Neck: Normal range of motion. No JVD present. No tracheal deviation present.  Pulmonary/Chest: Effort normal. No stridor.  Genitourinary:  Genitourinary Comments: Incisions along the 3:00 and 9:00 of the anus with small amount of drainage noted, small amount of induration on the right perirectal region  Neurological: He is alert and oriented to person, place, and time. Coordination normal.  Psychiatric: He has a normal mood and affect. His behavior is normal. Judgment and thought content normal.  Nursing note and vitals reviewed.    ED Treatments / Results  Labs (all  labs ordered are listed, but only abnormal results are displayed) Labs Reviewed  CBC WITH DIFFERENTIAL/PLATELET - Abnormal; Notable for the following components:      Result Value   MCV 76.8 (*)    MCH 24.6 (*)    All other components within normal limits  BASIC METABOLIC PANEL - Abnormal; Notable for the following components:   Potassium 3.4 (*)    BUN 5 (*)    All other components within normal limits  I-STAT TROPONIN, ED    EKG None  Radiology No results found.  Procedures Procedures (including critical care time)  Medications Ordered in ED Medications - No data to display   Initial Impression / Assessment and Plan / ED Course  I have reviewed the triage vital signs and the nursing notes.  Pertinent labs & imaging results that were available during my care of the patient were reviewed by me and considered in my medical decision making (see chart for details).     Labs: CBC, BMP  Imaging: AP pelvis with  Consults:  Therapeutics:  Discharge Meds:   Assessment/Plan: 37 year old male presents today with complaints of chronic wound.  Patient does have very minimal amount of induration on the right perirectal region.  I question if this is chronic in nature, but will obtain CT scan to evaluate for complicating presentation.  Patient care taken over from attending physician pending CT result.    Final Clinical Impressions(s) / ED Diagnoses   Final diagnoses:  Visit for wound check    ED Discharge Orders    None       Francee Gentile 01/30/18 2200    Charlesetta Shanks, MD 01/30/18 2352

## 2018-01-31 NOTE — ED Notes (Signed)
Patient Alert and oriented to baseline. Stable and ambulatory to baseline. Patient verbalized understanding of the discharge instructions.  Patient belongings were taken by the patient.   

## 2018-11-07 IMAGING — US US SCROTUM W/ DOPPLER COMPLETE
1 series · 13 of 25 positions shown · non-contrast
Comparison: CT of the abdomen pelvis dated 11/03/2015

CLINICAL DATA: 36-year-old male with left testicular pain x1 week.

EXAM:
SCROTAL ULTRASOUND
DOPPLER ULTRASOUND OF THE TESTICLES
TECHNIQUE: Complete ultrasound examination of the testicles, epididymis, and
other scrotal structures was performed. Color and spectral Doppler
ultrasound were also utilized to evaluate blood flow to the
testicles.

[Series 1: us scrotum w/ doppler complete · 0.06mm/px · 13 of 71 slices shown]
[im 1/71]
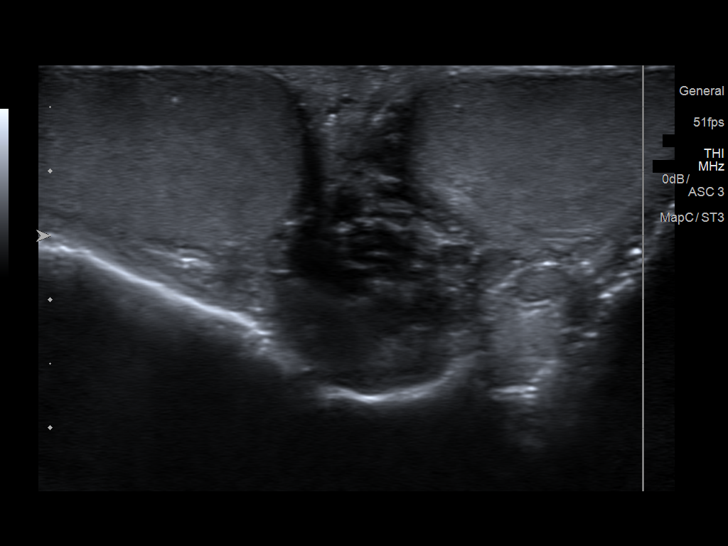
[im 6/71]
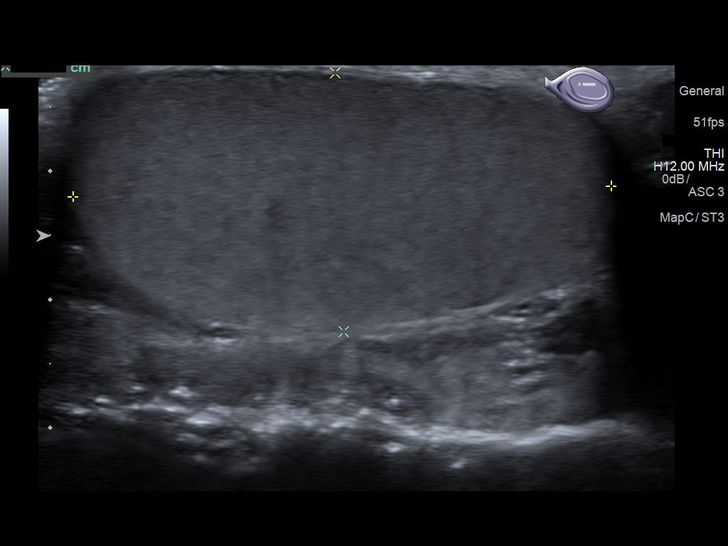
[im 12/71]
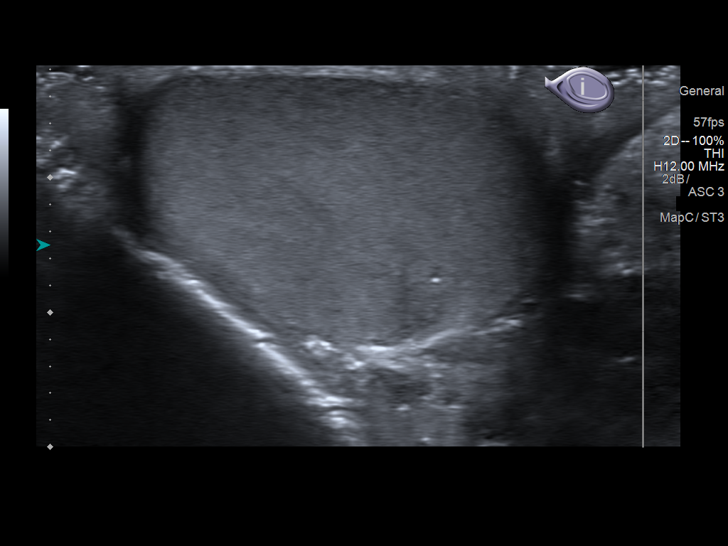
[im 18/71]
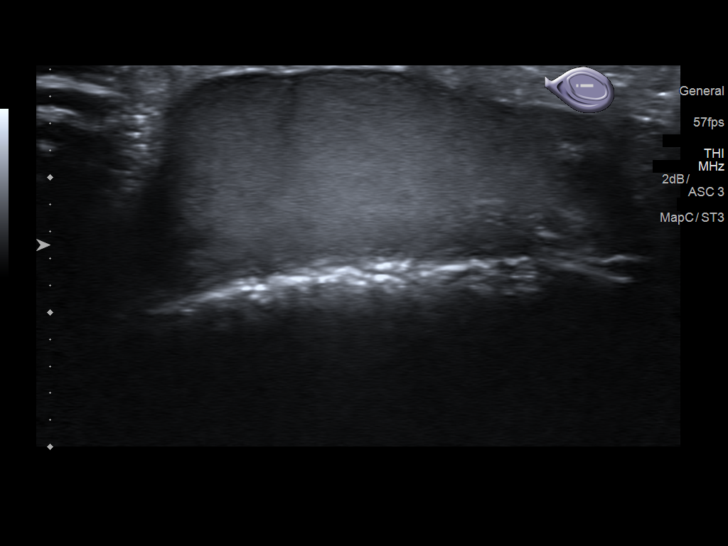
[im 24/71]
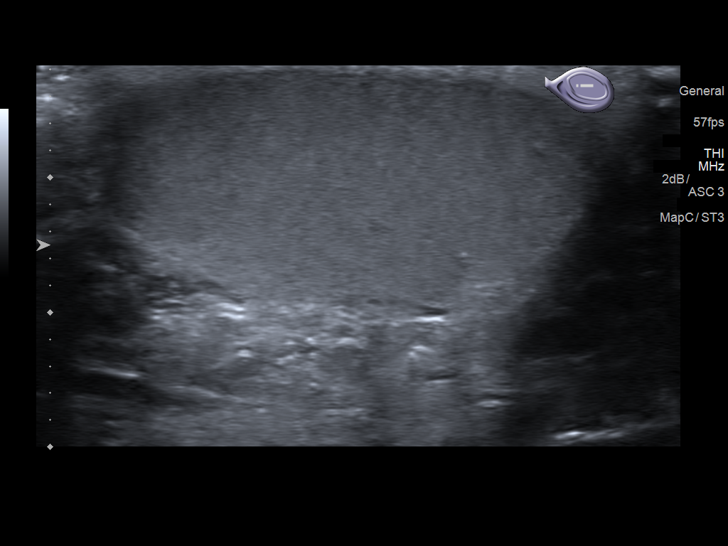
[im 30/71]
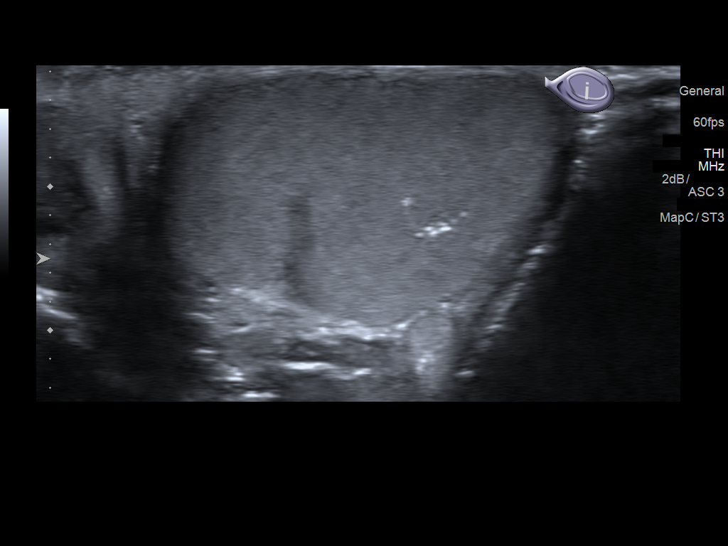
[im 36/71]
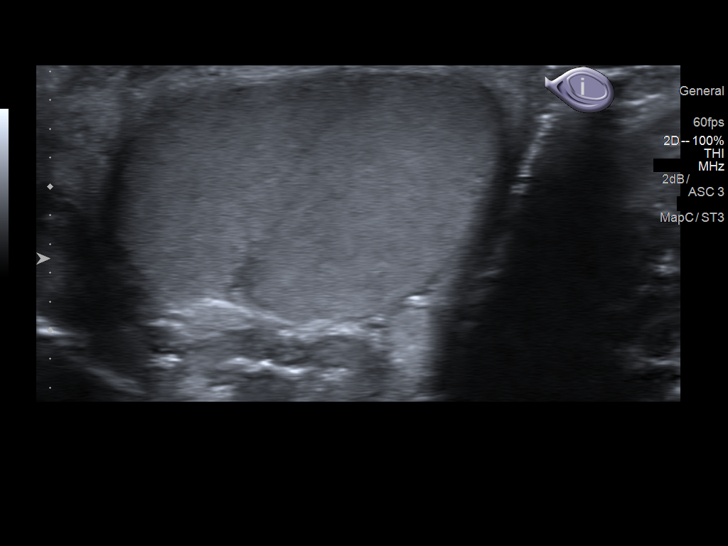
[im 41/71]
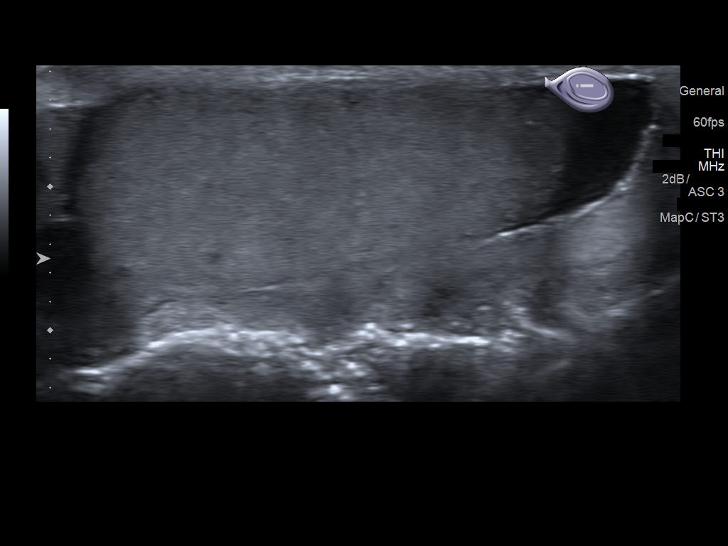
[im 47/71]
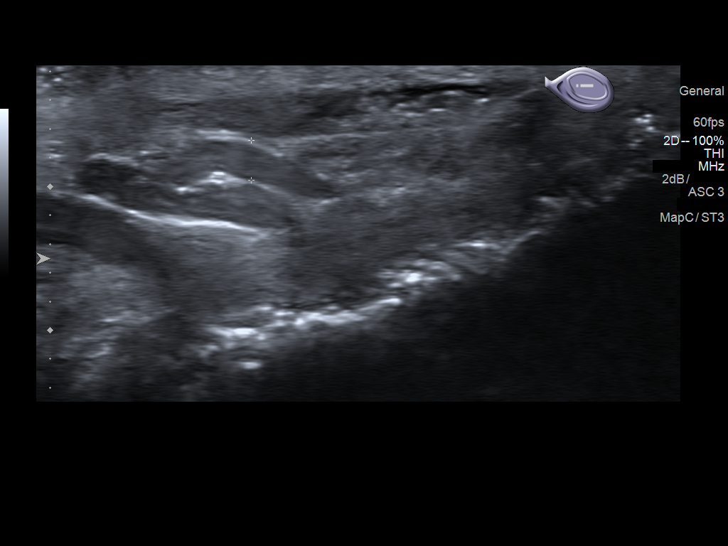
[im 53/71]
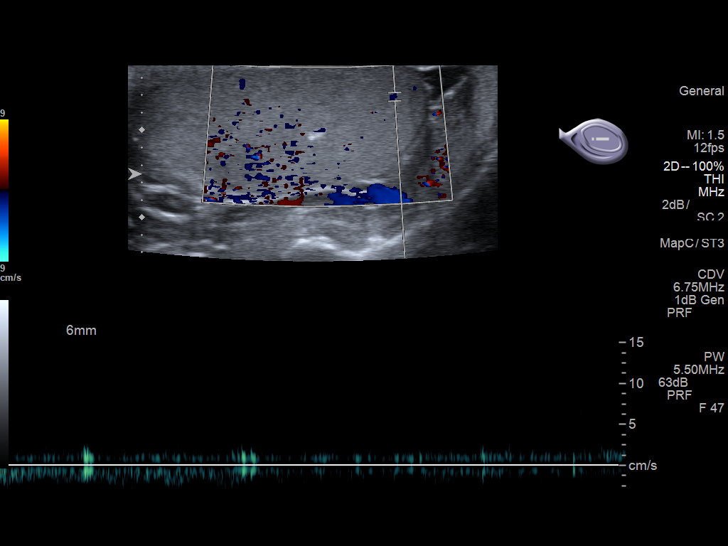
[im 59/71]
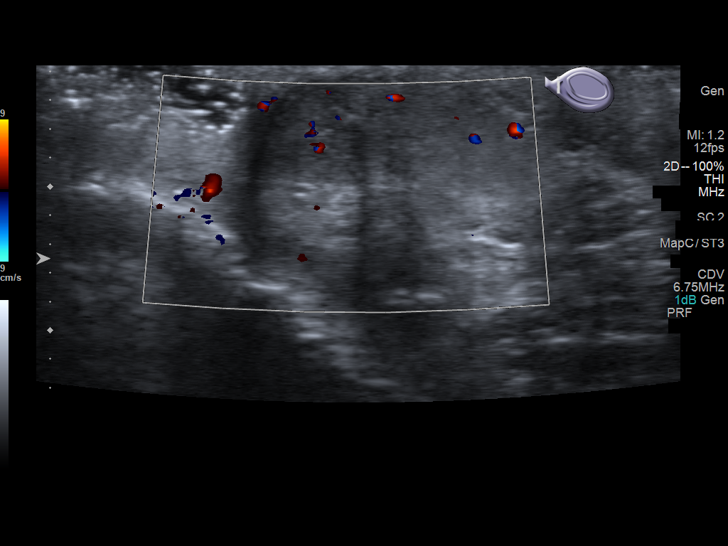
[im 65/71]
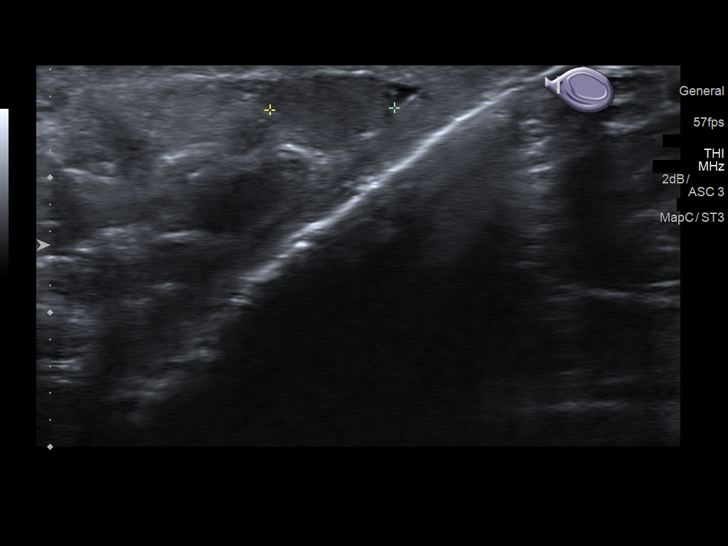
[im 71/71]
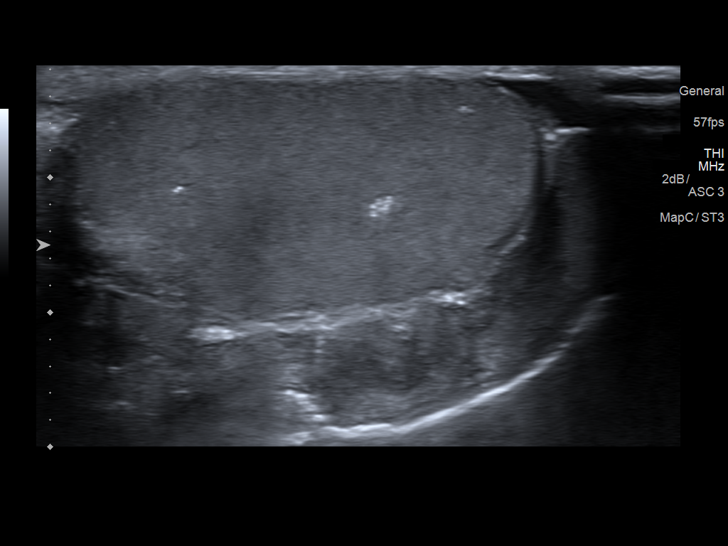

[13 of 25 positions shown; findings below may reference images not displayed]

FINDINGS: Right testicle

Measurements: 4.2 x 2.0 x 2.7 cm. Small scattered areas of
microcalcification noted, likely sequela of prior insult. There is
no associated mass.

Left testicle

Measurements: 3.6 x 1.6 x 2.7 cm. Small scattered areas of
microcalcification noted, likely sequela of prior insult. There is
no associated mass.

Right epididymis:  Normal in size and appearance.

Left epididymis:  Normal in size and appearance.

Hydrocele:  None visualized.

Varicocele:  Mild left varicocele.

Pulsed Doppler interrogation of both testes demonstrates normal low
resistance arterial and venous waveforms bilaterally.
IMPRESSION: 1. No acute findings. Doppler detected bilateral arterial and venous
flow.
2. Small foci of calcifications without associated mass, likely
sequela of prior infection/inflammation. Clinical correlation is
recommended.
3. Mild left testicular varicocele.

## 2019-04-03 IMAGING — CT CT ABD-PELV W/ CM
2 of 4 series · 15 of 46 positions shown, 17 images · IV contrast (iopamidol)
Comparison: Abdominal CT dated 11/03/2015 and testicular ultrasound
dated 06/24/2017

CLINICAL DATA: 36-year-old male with perianal abscess.

EXAM:
CT ABDOMEN AND PELVIS WITH CONTRAST
TECHNIQUE: Multidetector CT imaging of the abdomen and pelvis was performed
using the standard protocol following bolus administration of
intravenous contrast.
CONTRAST:  100mL WNTNQ7-LYY IOPAMIDOL (WNTNQ7-LYY) INJECTION 61%

[Series 3: abdomen 5.0 · axial · 0.66mm/px · z∈[+641,+1096]mm · 12 of 101 slices shown, 14 images]
[im 5/101  soft-tissue]
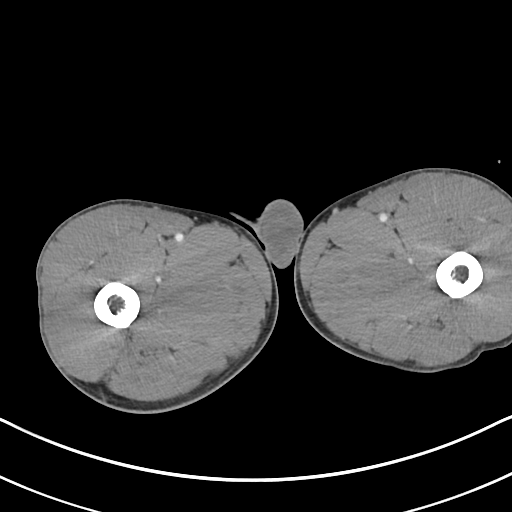
[im 5/101  bone]
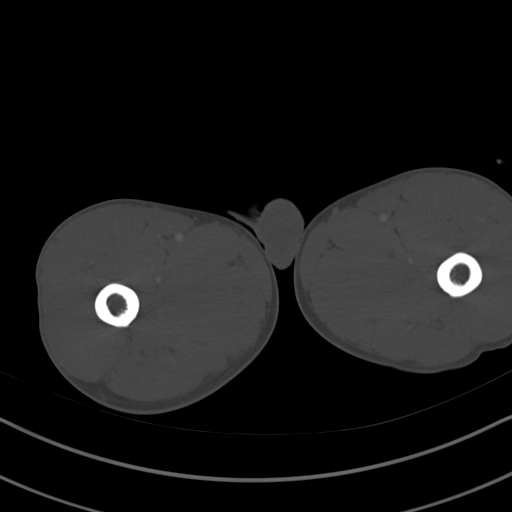
[im 14/101  soft-tissue]
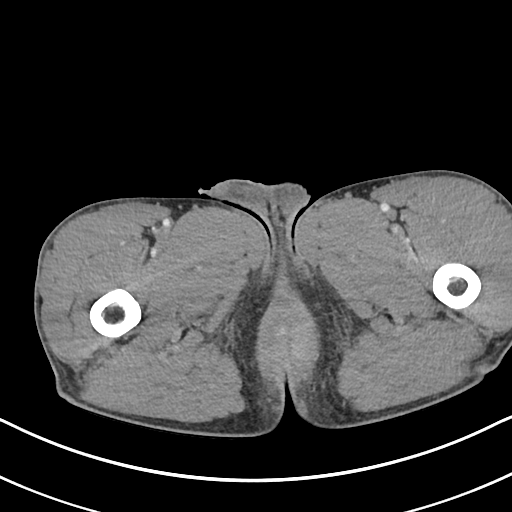
[im 23/101  soft-tissue]
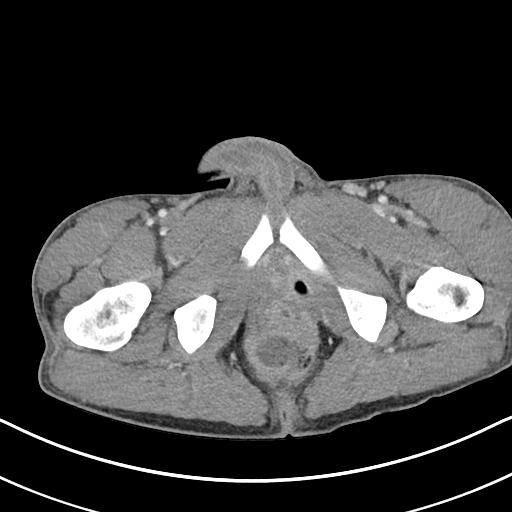
[im 32/101  soft-tissue]
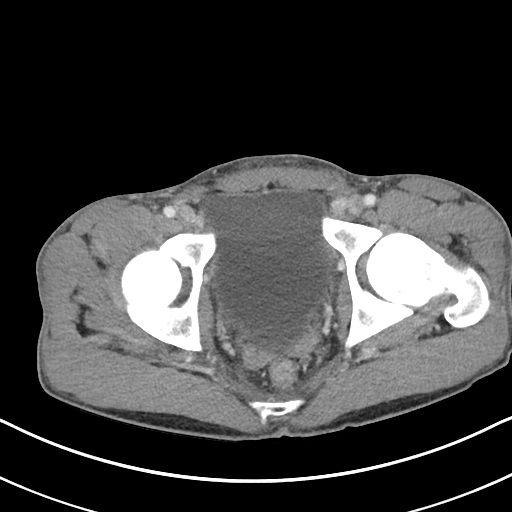
[im 37/101  soft-tissue]
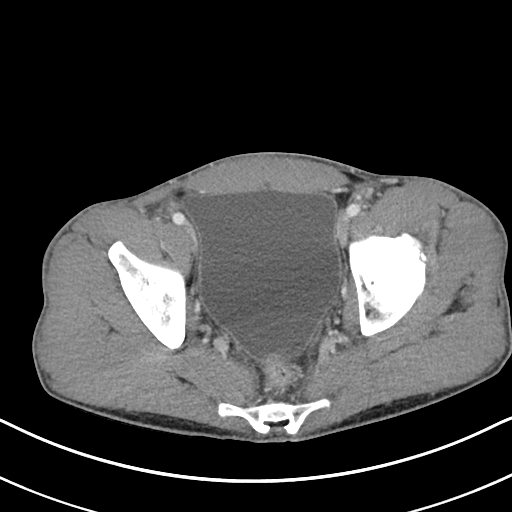
[im 46/101  soft-tissue]
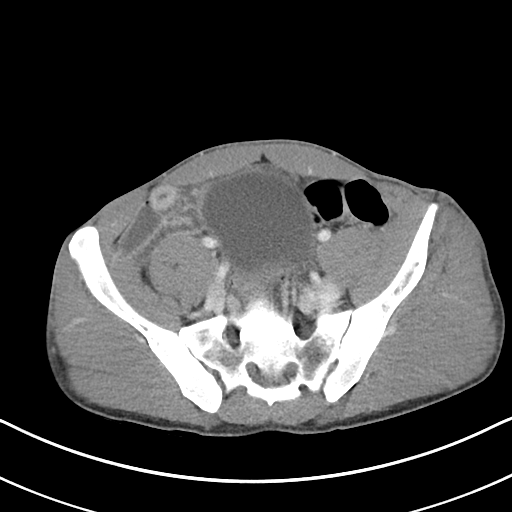
[im 55/101  soft-tissue]
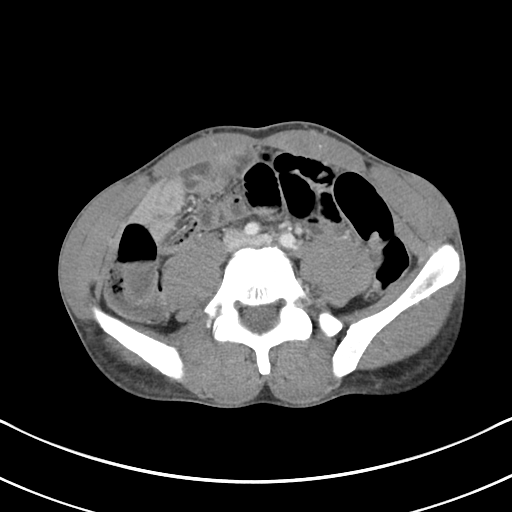
[im 64/101  soft-tissue]
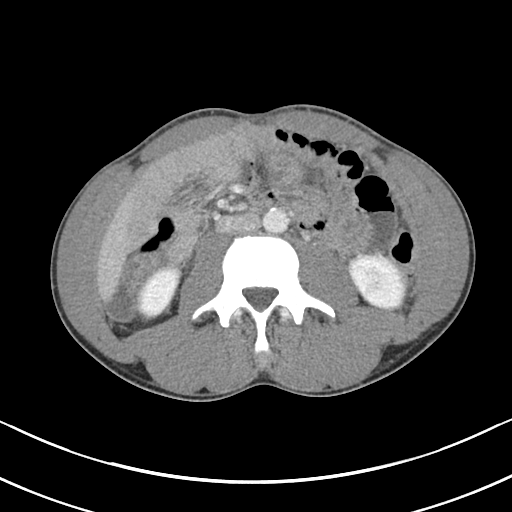
[im 69/101  soft-tissue]
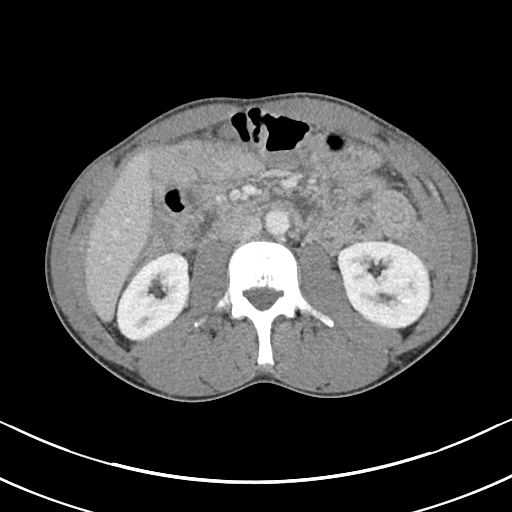
[im 69/101  bone]
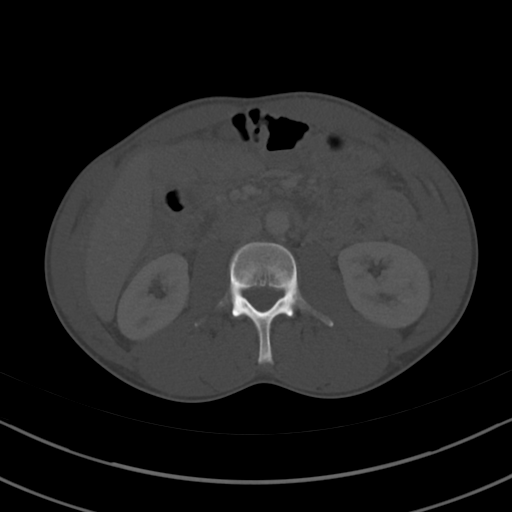
[im 78/101  soft-tissue]
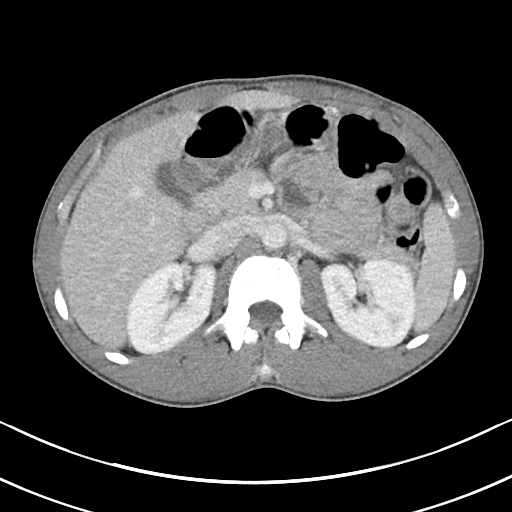
[im 87/101  soft-tissue]
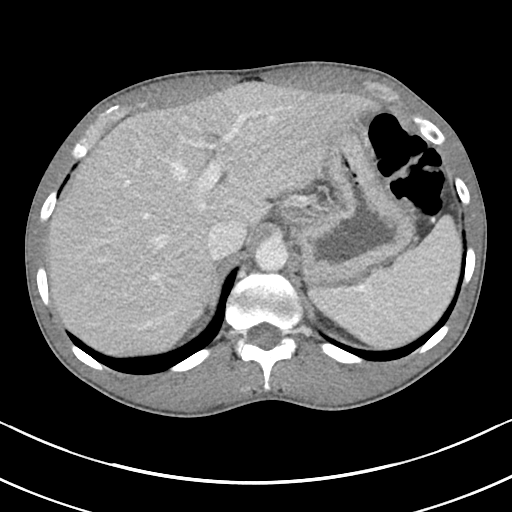
[im 96/101  soft-tissue]
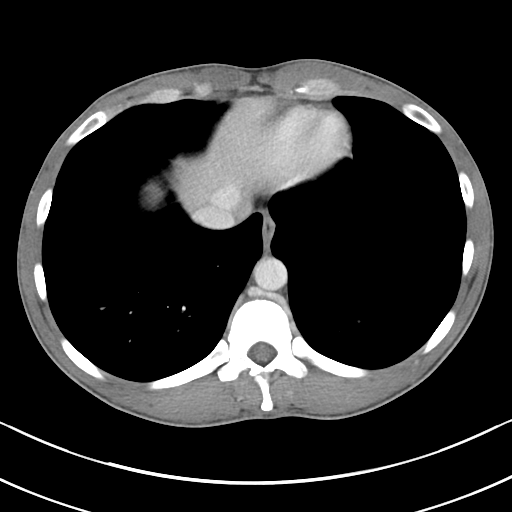

[Series 6: abdomen 3.0 mpr cor · coronal · 0.65mm/px · 3 of 87 slices shown]
[im 29/87  soft-tissue]
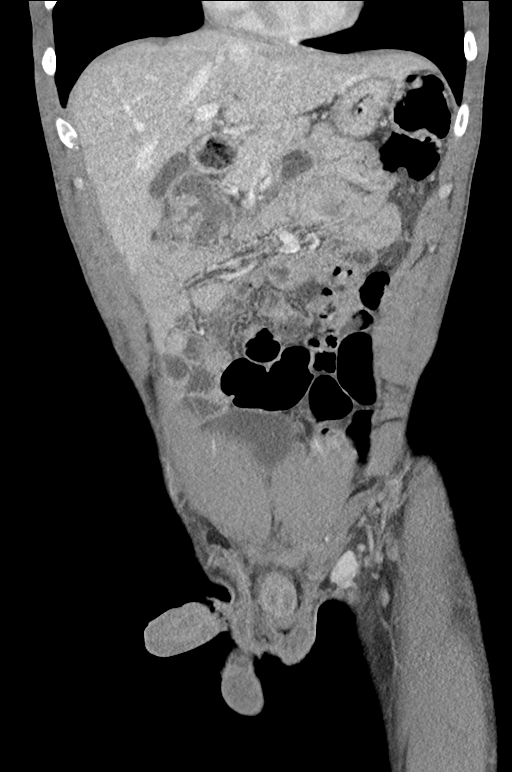
[im 39/87  soft-tissue]
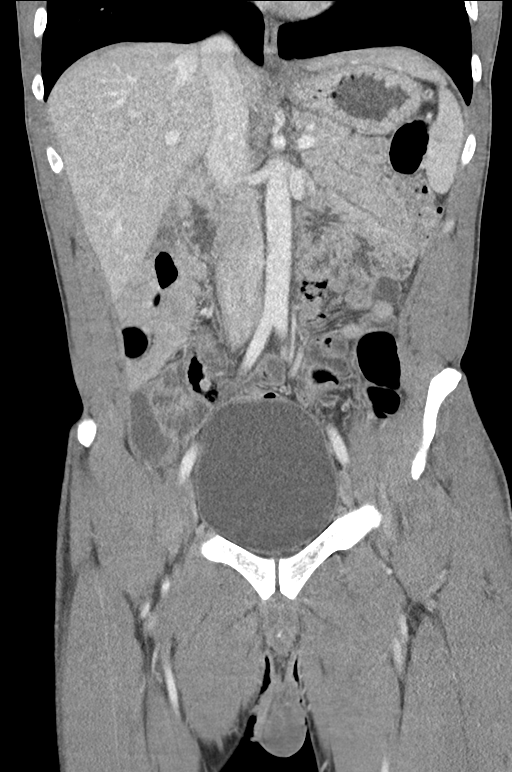
[im 48/87  soft-tissue]
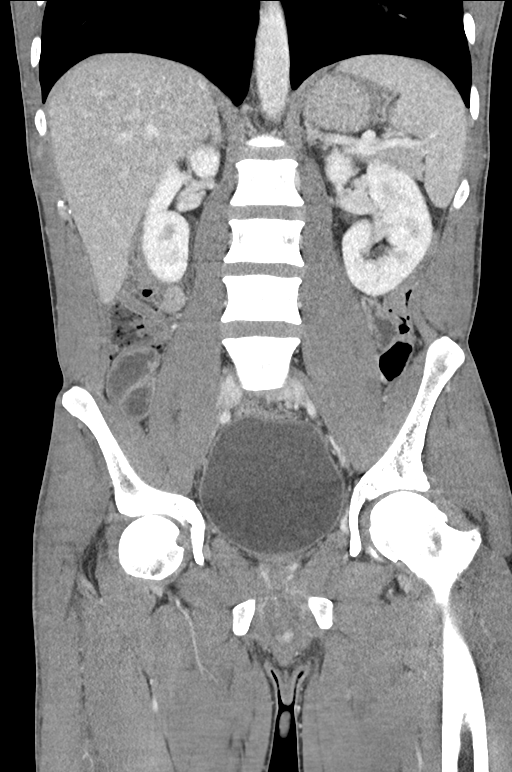

[15 of 46 positions shown; findings below may reference images not displayed]

FINDINGS: Lower chest: The visualized lung bases are clear.

No intra-abdominal free air or free fluid.

Hepatobiliary: No focal liver abnormality is seen. No gallstones,
gallbladder wall thickening, or biliary dilatation.

Pancreas: Unremarkable. No pancreatic ductal dilatation or
surrounding inflammatory changes.

Spleen: Normal in size without focal abnormality.

Adrenals/Urinary Tract: Adrenal glands are unremarkable. Kidneys are
normal, without renal calculi, focal lesion, or hydronephrosis.
Bladder is unremarkable.

Stomach/Bowel: There is a 7 mm ileo ileal intussusception in the
left upper abdomen, likely transient. There is no evidence of bowel
obstruction or active inflammation. Normal appendix.

Vascular/Lymphatic: Mild aortoiliac atherosclerotic disease. The
abdominal aorta and IVC are otherwise unremarkable. No portal venous
gas. There is no adenopathy.

Reproductive: The prostate and seminal vesicles are grossly
unremarkable. No pelvic mass.

Other: There is a 5 x 6 x 5 cm complex collection containing debris
and gas with thick enhancing peripheral wall in the left and
posterior perirectal soft tissues extending to the left perineum
most consistent with an abscess. A fistulous tract extends from this
collection to the left rectal wall (axial series 3 images 77 and 78
and coronal series 6 images 58-62).

Musculoskeletal: No acute or significant osseous findings.
IMPRESSION: 1. Perianal abscess with apparent fistulous tract to the lower
rectum. Clinical correlation is recommended.
2. No acute intra-abdominopelvic pathology. Probable transient ileo
ileal intussusception in the left upper abdomen. No bowel
obstruction or active inflammation. Normal appendix.

## 2021-04-05 ENCOUNTER — Other Ambulatory Visit: Payer: Self-pay

## 2021-04-05 ENCOUNTER — Emergency Department (HOSPITAL_COMMUNITY): Payer: Self-pay

## 2021-04-05 ENCOUNTER — Observation Stay (HOSPITAL_COMMUNITY)
Admission: EM | Admit: 2021-04-05 | Discharge: 2021-04-07 | Disposition: A | Discharge status: Home / Self Care | Payer: Self-pay | Attending: Surgery | Admitting: Surgery

## 2021-04-05 DIAGNOSIS — K611 Rectal abscess: Principal | ICD-10-CM | POA: Insufficient documentation

## 2021-04-05 DIAGNOSIS — F17210 Nicotine dependence, cigarettes, uncomplicated: Secondary | ICD-10-CM | POA: Insufficient documentation

## 2021-04-05 DIAGNOSIS — Z20822 Contact with and (suspected) exposure to covid-19: Secondary | ICD-10-CM | POA: Insufficient documentation

## 2021-04-05 HISTORY — DX: Prediabetes: R73.03

## 2021-04-05 LAB — COMPREHENSIVE METABOLIC PANEL
ALT: 20 U/L (ref 0–44)
AST: 25 U/L (ref 15–41)
Albumin: 3.7 g/dL (ref 3.5–5.0)
Alkaline Phosphatase: 96 U/L (ref 38–126)
Anion gap: 11 (ref 5–15)
BUN: 10 mg/dL (ref 6–20)
CO2: 20 mmol/L — ABNORMAL LOW (ref 22–32)
Calcium: 9.3 mg/dL (ref 8.9–10.3)
Chloride: 99 mmol/L (ref 98–111)
Creatinine, Ser: 1.15 mg/dL (ref 0.61–1.24)
GFR, Estimated: >60 mL/min (ref >60)
Glucose, Bld: 151 mg/dL — ABNORMAL HIGH (ref 70–99)
Potassium: 4.3 mmol/L (ref 3.5–5.1)
Sodium: 130 mmol/L — ABNORMAL LOW (ref 135–145)
Total Bilirubin: 0.5 mg/dL (ref 0.3–1.2)
Total Protein: 8.7 g/dL — ABNORMAL HIGH (ref 6.5–8.1)

## 2021-04-05 LAB — CBC WITH DIFFERENTIAL/PLATELET
Abs Immature Granulocytes: 0.09 10*3/uL — ABNORMAL HIGH (ref 0.00–0.07)
Basophils Absolute: 0.1 10*3/uL (ref 0.0–0.1)
Basophils Relative: 0 %
Eosinophils Absolute: 0.1 10*3/uL (ref 0.0–0.5)
Eosinophils Relative: 1 %
HCT: 46.9 % (ref 39.0–52.0)
Hemoglobin: 15.3 g/dL (ref 13.0–17.0)
Immature Granulocytes: 1 %
Lymphocytes Relative: 7 %
Lymphs Abs: 1.1 10*3/uL (ref 0.7–4.0)
MCH: 25.5 pg — ABNORMAL LOW (ref 26.0–34.0)
MCHC: 32.6 g/dL (ref 30.0–36.0)
MCV: 78.2 fL — ABNORMAL LOW (ref 80.0–100.0)
Monocytes Absolute: 1.8 10*3/uL — ABNORMAL HIGH (ref 0.1–1.0)
Monocytes Relative: 12 %
Neutro Abs: 12.4 10*3/uL — ABNORMAL HIGH (ref 1.7–7.7)
Neutrophils Relative %: 79 %
Platelets: 199 10*3/uL (ref 150–400)
RBC: 6 MIL/uL — ABNORMAL HIGH (ref 4.22–5.81)
RDW: 14 % (ref 11.5–15.5)
WBC: 15.5 10*3/uL — ABNORMAL HIGH (ref 4.0–10.5)
nRBC: 0 % (ref 0.0–0.2)

## 2021-04-05 LAB — LACTIC ACID, PLASMA: Lactic Acid, Venous: 1.8 mmol/L (ref 0.5–1.9)

## 2021-04-05 MED ORDER — IOHEXOL 300 MG/ML  SOLN
100.0000 mL | Freq: Once | INTRAMUSCULAR | Status: AC | PRN
  Administered 2021-04-05: 100 mL via INTRAVENOUS

## 2021-04-05 NOTE — ED Triage Notes (Signed)
Pt from home for eval of draining abscess to L buttocks, first noticed yesterday. Started draining on the way to the hospital today.

## 2021-04-05 NOTE — ED Provider Triage Note (Signed)
Emergency Medicine Provider Triage Evaluation Note  Eugene Shelton , a 41 y.o. male  was evaluated in triage.  Pt complains of abscess noted to left buttocks onset yesterday.  Patient notes that the area started to drain normally to the hospital.  He has a history of perirectal abscess in 2019 that was surgically drained.  He has associated chills.  Has tried Tylenol, warm soaks.  Denies fever, nausea, vomiting, abdominal pain, testicular pain/swelling.  Patient notes he is prediabetic.    Review of Systems  Positive: As per HPI above Negative: Fever, nausea, vomiting, abdominal pain, testicular pain/swelling  Physical Exam  BP (!) 152/118    Pulse (!) 134    Temp 98.2 F (36.8 C) (Oral)    Resp 16    SpO2 98%  Gen:   Awake, no distress   Resp:  Normal effort  MSK:   Moves extremities without difficulty  Other:  RN chaperone present for exam.  Draining abscess noted to left buttocks.  Area of induration noted to left buttocks.  Tenderness to palpation noted to left lower gluteal region.  No overlying skin changes to testicles.  Medical Decision Making  Medically screening exam initiated at 4:10 PM.  Appropriate orders placed.  Eugene Shelton was informed that the remainder of the evaluation will be completed by another provider, this initial triage assessment does not replace that evaluation, and the importance of remaining in the ED until their evaluation is complete.    Hunner Garcon A, PA-C 04/05/21 1620

## 2021-04-06 ENCOUNTER — Encounter (HOSPITAL_COMMUNITY)
Admission: EM | Disposition: A | Discharge status: Home / Self Care | Payer: Self-pay | Source: Home / Self Care | Attending: Emergency Medicine

## 2021-04-06 ENCOUNTER — Observation Stay (HOSPITAL_COMMUNITY): Payer: Self-pay | Admitting: Anesthesiology

## 2021-04-06 ENCOUNTER — Encounter (HOSPITAL_COMMUNITY): Payer: Self-pay

## 2021-04-06 HISTORY — PX: INCISION AND DRAINAGE PERIRECTAL ABSCESS: SHX1804

## 2021-04-06 LAB — GLUCOSE, CAPILLARY
Glucose-Capillary: 113 mg/dL — ABNORMAL HIGH (ref 70–99)
Glucose-Capillary: 98 mg/dL (ref 70–99)

## 2021-04-06 LAB — RESP PANEL BY RT-PCR (FLU A&B, COVID) ARPGX2
Influenza A by PCR: NEGATIVE
Influenza B by PCR: NEGATIVE
SARS Coronavirus 2 by RT PCR: NEGATIVE

## 2021-04-06 LAB — HIV ANTIBODY (ROUTINE TESTING W REFLEX): HIV Screen 4th Generation wRfx: NONREACTIVE

## 2021-04-06 SURGERY — INCISION AND DRAINAGE, ABSCESS, PERIRECTAL
Anesthesia: General | Site: Perineum

## 2021-04-06 MED ORDER — HYDROMORPHONE HCL 1 MG/ML IJ SOLN
INTRAMUSCULAR | Status: AC
  Filled 2021-04-06: qty 0.5

## 2021-04-06 MED ORDER — PIPERACILLIN-TAZOBACTAM 3.375 G IVPB
3.3750 g | Freq: Three times a day (TID) | INTRAVENOUS | Status: DC
  Administered 2021-04-06 – 2021-04-07 (×4): 3.375 g via INTRAVENOUS
  Filled 2021-04-06 (×4): qty 50

## 2021-04-06 MED ORDER — FENTANYL CITRATE (PF) 100 MCG/2ML IJ SOLN: 25.0000 ug | INTRAMUSCULAR | Status: DC | PRN

## 2021-04-06 MED ORDER — MORPHINE SULFATE (PF) 4 MG/ML IV SOLN
4.0000 mg | INTRAVENOUS | Status: DC | PRN
  Administered 2021-04-06: 4 mg via INTRAVENOUS
  Filled 2021-04-06: qty 1

## 2021-04-06 MED ORDER — MIDAZOLAM HCL 2 MG/2ML IJ SOLN
INTRAMUSCULAR | Status: DC | PRN
  Administered 2021-04-06: 2 mg via INTRAVENOUS

## 2021-04-06 MED ORDER — PROPOFOL 10 MG/ML IV BOLUS
INTRAVENOUS | Status: DC | PRN
  Administered 2021-04-06: 170 mg via INTRAVENOUS

## 2021-04-06 MED ORDER — LACTATED RINGERS IV SOLN: INTRAVENOUS | Status: DC

## 2021-04-06 MED ORDER — FENTANYL CITRATE (PF) 250 MCG/5ML IJ SOLN
INTRAMUSCULAR | Status: DC | PRN
  Administered 2021-04-06 (×2): 50 ug via INTRAVENOUS
  Administered 2021-04-06: 100 ug via INTRAVENOUS
  Administered 2021-04-06: 50 ug via INTRAVENOUS

## 2021-04-06 MED ORDER — DEXTROSE-NACL 5-0.9 % IV SOLN: INTRAVENOUS | Status: DC

## 2021-04-06 MED ORDER — CHLORHEXIDINE GLUCONATE 0.12 % MT SOLN
OROMUCOSAL | Status: AC
  Administered 2021-04-06: 15 mL via OROMUCOSAL
  Filled 2021-04-06: qty 15

## 2021-04-06 MED ORDER — OXYCODONE HCL 5 MG PO TABS
ORAL_TABLET | ORAL | Status: AC
  Filled 2021-04-06: qty 2

## 2021-04-06 MED ORDER — ONDANSETRON 4 MG PO TBDP: 4.0000 mg | ORAL_TABLET | Freq: Four times a day (QID) | ORAL | Status: DC | PRN

## 2021-04-06 MED ORDER — ORAL CARE MOUTH RINSE: 15.0000 mL | Freq: Once | OROMUCOSAL | Status: AC

## 2021-04-06 MED ORDER — OXYCODONE HCL 5 MG PO TABS
5.0000 mg | ORAL_TABLET | ORAL | Status: DC | PRN
  Administered 2021-04-06: 10 mg via ORAL
  Administered 2021-04-06: 5 mg via ORAL
  Administered 2021-04-06 – 2021-04-07 (×3): 10 mg via ORAL
  Filled 2021-04-06 (×4): qty 2

## 2021-04-06 MED ORDER — CHLORHEXIDINE GLUCONATE 0.12 % MT SOLN: 15.0000 mL | Freq: Once | OROMUCOSAL | Status: AC

## 2021-04-06 MED ORDER — BUPIVACAINE-EPINEPHRINE 0.5% -1:200000 IJ SOLN
INTRAMUSCULAR | Status: AC
  Filled 2021-04-06: qty 1

## 2021-04-06 MED ORDER — PROMETHAZINE HCL 25 MG/ML IJ SOLN: 6.2500 mg | INTRAMUSCULAR | Status: DC | PRN

## 2021-04-06 MED ORDER — LIDOCAINE 2% (20 MG/ML) 5 ML SYRINGE
INTRAMUSCULAR | Status: DC | PRN
  Administered 2021-04-06: 100 mg via INTRAVENOUS

## 2021-04-06 MED ORDER — MIDAZOLAM HCL 2 MG/2ML IJ SOLN
INTRAMUSCULAR | Status: AC
  Filled 2021-04-06: qty 2

## 2021-04-06 MED ORDER — ACETAMINOPHEN 500 MG PO TABS
1000.0000 mg | ORAL_TABLET | Freq: Once | ORAL | Status: AC
Start: 2021-04-06 — End: 2021-04-06

## 2021-04-06 MED ORDER — KETOROLAC TROMETHAMINE 30 MG/ML IJ SOLN: 30.0000 mg | Freq: Four times a day (QID) | INTRAMUSCULAR | Status: DC | PRN

## 2021-04-06 MED ORDER — HYDROMORPHONE HCL 1 MG/ML IJ SOLN
1.0000 mg | INTRAMUSCULAR | Status: DC | PRN
  Administered 2021-04-07 (×2): 1 mg via INTRAVENOUS
  Filled 2021-04-06 (×2): qty 1

## 2021-04-06 MED ORDER — CELECOXIB 200 MG PO CAPS
ORAL_CAPSULE | ORAL | Status: AC
  Administered 2021-04-06: 200 mg via ORAL
  Filled 2021-04-06: qty 1

## 2021-04-06 MED ORDER — BUPIVACAINE-EPINEPHRINE 0.5% -1:200000 IJ SOLN
INTRAMUSCULAR | Status: DC | PRN
  Administered 2021-04-06: 30 mL

## 2021-04-06 MED ORDER — 0.9 % SODIUM CHLORIDE (POUR BTL) OPTIME
TOPICAL | Status: DC | PRN
  Administered 2021-04-06: 1000 mL

## 2021-04-06 MED ORDER — ACETAMINOPHEN 500 MG PO TABS
ORAL_TABLET | ORAL | Status: AC
  Administered 2021-04-06: 1000 mg via ORAL
  Filled 2021-04-06: qty 2

## 2021-04-06 MED ORDER — ONDANSETRON HCL 4 MG/2ML IJ SOLN
4.0000 mg | Freq: Once | INTRAMUSCULAR | Status: AC
  Administered 2021-04-06: 4 mg via INTRAVENOUS
  Filled 2021-04-06: qty 2

## 2021-04-06 MED ORDER — AMISULPRIDE (ANTIEMETIC) 5 MG/2ML IV SOLN: 10.0000 mg | Freq: Once | INTRAVENOUS | Status: DC | PRN

## 2021-04-06 MED ORDER — FENTANYL CITRATE (PF) 250 MCG/5ML IJ SOLN
INTRAMUSCULAR | Status: AC
  Filled 2021-04-06: qty 5

## 2021-04-06 MED ORDER — HYDROMORPHONE HCL 1 MG/ML IJ SOLN
0.5000 mg | INTRAMUSCULAR | Status: DC | PRN
  Administered 2021-04-06: .5 mg via INTRAVENOUS

## 2021-04-06 MED ORDER — ONDANSETRON HCL 4 MG/2ML IJ SOLN: 4.0000 mg | Freq: Four times a day (QID) | INTRAMUSCULAR | Status: DC | PRN

## 2021-04-06 MED ORDER — PROPOFOL 10 MG/ML IV BOLUS
INTRAVENOUS | Status: AC
  Filled 2021-04-06: qty 20

## 2021-04-06 MED ORDER — LACTATED RINGERS IV BOLUS
1000.0000 mL | Freq: Once | INTRAVENOUS | Status: AC
  Administered 2021-04-06: 1000 mL via INTRAVENOUS

## 2021-04-06 MED ORDER — CELECOXIB 200 MG PO CAPS: 200.0000 mg | ORAL_CAPSULE | Freq: Once | ORAL | Status: AC

## 2021-04-06 MED ORDER — ONDANSETRON HCL 4 MG/2ML IJ SOLN
INTRAMUSCULAR | Status: DC | PRN
  Administered 2021-04-06: 4 mg via INTRAVENOUS

## 2021-04-06 SURGICAL SUPPLY — 31 items
BAG COUNTER SPONGE SURGICOUNT (BAG) ×2 IMPLANT
BNDG GAUZE ELAST 4 BULKY (GAUZE/BANDAGES/DRESSINGS) ×1 IMPLANT
CANISTER SUCT 3000ML PPV (MISCELLANEOUS) ×2 IMPLANT
COVER SURGICAL LIGHT HANDLE (MISCELLANEOUS) ×2 IMPLANT
DRAPE UTILITY XL STRL (DRAPES) ×4 IMPLANT
DRSG PAD ABDOMINAL 8X10 ST (GAUZE/BANDAGES/DRESSINGS) ×2 IMPLANT
ELECT CAUTERY BLADE 6.4 (BLADE) ×2 IMPLANT
ELECT REM PT RETURN 9FT ADLT (ELECTROSURGICAL) ×2
ELECTRODE REM PT RTRN 9FT ADLT (ELECTROSURGICAL) ×1 IMPLANT
GAUZE PACKING IODOFORM 1X5 (PACKING) IMPLANT
GAUZE SPONGE 4X4 12PLY STRL (GAUZE/BANDAGES/DRESSINGS) ×2 IMPLANT
GLOVE SURG SIGNA 7.5 PF LTX (GLOVE) ×2 IMPLANT
GOWN STRL REUS W/ TWL LRG LVL3 (GOWN DISPOSABLE) ×1 IMPLANT
GOWN STRL REUS W/ TWL XL LVL3 (GOWN DISPOSABLE) ×1 IMPLANT
GOWN STRL REUS W/TWL LRG LVL3 (GOWN DISPOSABLE) ×1
GOWN STRL REUS W/TWL XL LVL3 (GOWN DISPOSABLE) ×1
KIT BASIN OR (CUSTOM PROCEDURE TRAY) ×2 IMPLANT
KIT TURNOVER KIT B (KITS) ×2 IMPLANT
NS IRRIG 1000ML POUR BTL (IV SOLUTION) ×2 IMPLANT
PACK LITHOTOMY IV (CUSTOM PROCEDURE TRAY) ×2 IMPLANT
PAD ARMBOARD 7.5X6 YLW CONV (MISCELLANEOUS) ×2 IMPLANT
PENCIL BUTTON HOLSTER BLD 10FT (ELECTRODE) ×2 IMPLANT
SPONGE T-LAP 18X18 ~~LOC~~+RFID (SPONGE) ×2 IMPLANT
SWAB COLLECTION DEVICE MRSA (MISCELLANEOUS) IMPLANT
SWAB CULTURE ESWAB REG 1ML (MISCELLANEOUS) IMPLANT
SYR BULB EAR ULCER 3OZ GRN STR (SYRINGE) ×2 IMPLANT
TOWEL GREEN STERILE (TOWEL DISPOSABLE) ×2 IMPLANT
TOWEL GREEN STERILE FF (TOWEL DISPOSABLE) ×2 IMPLANT
TUBE CONNECTING 12X1/4 (SUCTIONS) ×2 IMPLANT
UNDERPAD 30X36 HEAVY ABSORB (UNDERPADS AND DIAPERS) ×2 IMPLANT
YANKAUER SUCT BULB TIP NO VENT (SUCTIONS) ×2 IMPLANT

## 2021-04-06 NOTE — Progress Notes (Signed)
Patient ID: Sherilyn Dacosta, male   DOB: 11/11/1980, 41 y.o.   MRN: [id number]   Pre Procedure note for inpatients:   Sherilyn Dacosta has been scheduled for Procedure(s): IRRIGATION AND DEBRIDEMENT PERIRECTAL ABSCESS (N/A) today. The various methods of treatment have been discussed with the patient. After consideration of the risks, benefits and treatment options the patient has consented to the planned procedure.   The patient has been seen and labs reviewed. There are no changes in the patients condition to prevent proceeding with the planned procedure today.  Recent labs:  Lab Results  Component Value Date   WBC 15.5 (H) 04/05/2021   HGB 15.3 04/05/2021   HCT 46.9 04/05/2021   PLT 199 04/05/2021   GLUCOSE 151 (H) 04/05/2021   ALT 20 04/05/2021   AST 25 04/05/2021   NA 130 (L) 04/05/2021   K 4.3 04/05/2021   CL 99 04/05/2021   CREATININE 1.15 04/05/2021   BUN 10 04/05/2021   CO2 20 (L) 04/05/2021   HGBA1C 5.9 (H) 06/24/2017    Abigail Miyamoto, MD 04/06/2021 8:24 AM

## 2021-04-06 NOTE — TOC Progression Note (Signed)
Transition of Care [facility]) - CM/SW Discharge Note   Patient Details  Name: Zayvion Koltun MRN: [id number] Date of Birth: [birth year]  [facility] of Care [facility]) CM/SW Contact:  Marilu Favre, RN Phone Number: 04/06/2021, 4:01 PM   Clinical Narrative:     Scheduled follow up appointment , information placed on AVS.   Changed pharmacy to [facility], on discharge would ask [place] to call patient with cost of medications, if cannot afford will see if medications covered by [facility]     Transition of Care [facility]) Screening Note   Patient Details  Name: Zyshaun Eiben Date of Birth: 29-May-1980   Transition of Care Department [facility]) has reviewed patient and no TOC needs have been identified at this time. We will continue to monitor patient advancement through interdisciplinary progression rounds. If new patient transition needs arise, please place a TOC consult.          Patient Goals and CMS Choice        Discharge Placement                       Discharge Plan and Services                                     Social Determinants of Health (SDOH) Interventions     Readmission Risk Interventions No flowsheet data found.

## 2021-04-06 NOTE — ED Notes (Signed)
Report called to ST RN.

## 2021-04-06 NOTE — H&P (Signed)
Eugene Shelton is an 41 y.o. male.   Chief Complaint: Abscess drainage HPI: Patient is a 41 year old male who comes in secondary to a peritoneal abscess.  Patient states he previously had this drained in 2019.  Patient comes in with a 2-day history of pain.  Patient states that the area began draining on his way to the hospital.  Patient does state he has more anterior pain than he did previously.  He states that from his previous operation he felt like the left side never healed.  Patient underwent CT scan.  This was significant for a small 2 x 1 cm perianal abscess.  I did review the CT scans personally.  Patient with leukocytosis.    Past Medical History:  Diagnosis Date   Medical history non-contributory     Past Surgical History:  Procedure Laterality Date   ABSCESS DRAINAGE  06/25/2017   UNDER ANESTHESIA (N/A Rectum)   INCISION AND DRAINAGE PERIRECTAL ABSCESS N/A 06/25/2017   Procedure: IRRIGATION AND DEBRIDEMENT PERIRECTAL ABSCESS;  Surgeon: Emelia Loron, MD;  Location: [facility] OR;  Service: General;  Laterality: N/A;    No family history on file. Social History:  reports that he has been smoking cigarettes. He has a 7.50 pack-year smoking history. He has never used smokeless tobacco. He reports current alcohol use. He reports that he does not currently use drugs.  Allergies: No Known Allergies  (Not in a hospital admission)   Results for orders placed or performed during the hospital encounter of 04/05/21 (from the past 48 hour(s))  Lactic acid, plasma     Status: None   Collection Time: 04/05/21  5:39 PM  Result Value Ref Range   Lactic Acid, Venous 1.8 0.5 - 1.9 mmol/L    Comment: Performed at [facility], [address], [place], [place] [address]  Comprehensive metabolic panel     Status: Abnormal   Collection Time: 04/05/21  7:02 PM  Result Value Ref Range   Sodium 130 (L) 135 - 145 mmol/L   Potassium 4.3 3.5 - 5.1 mmol/L   Chloride 99 98 - 111 mmol/L    CO2 20 (L) 22 - 32 mmol/L   Glucose, Bld 151 (H) 70 - 99 mg/dL    Comment: Glucose reference range applies only to samples taken after fasting for at least 8 hours.   BUN 10 6 - 20 mg/dL   Creatinine, Ser 1.91 0.61 - 1.24 mg/dL   Calcium 9.3 8.9 - 47.8 mg/dL   Total Protein 8.7 (H) 6.5 - 8.1 g/dL   Albumin 3.7 3.5 - 5.0 g/dL   AST 25 15 - 41 U/L   ALT 20 0 - 44 U/L   Alkaline Phosphatase 96 38 - 126 U/L   Total Bilirubin 0.5 0.3 - 1.2 mg/dL   GFR, Estimated >29 >56 mL/min    Comment: (NOTE) Calculated using the CKD-EPI Creatinine Equation (2021)    Anion gap 11 5 - 15    Comment: Performed at [facility], [address], [place], [place] [address]  CBC with Differential     Status: Abnormal   Collection Time: 04/05/21  7:02 PM  Result Value Ref Range   WBC 15.5 (H) 4.0 - 10.5 K/uL   RBC 6.00 (H) 4.22 - 5.81 MIL/uL   Hemoglobin 15.3 13.0 - 17.0 g/dL   HCT 65.7 84.6 - 96.2 %   MCV 78.2 (L) 80.0 - 100.0 fL   MCH 25.5 (L) 26.0 - 34.0 pg  MCHC 32.6 30.0 - 36.0 g/dL   RDW 16.114.0 09.611.5 - 04.515.5 %   Platelets 199 150 - 400 K/uL   nRBC 0.0 0.0 - 0.2 %   Neutrophils Relative % 79 %   Neutro Abs 12.4 (H) 1.7 - 7.7 K/uL   Lymphocytes Relative 7 %   Lymphs Abs 1.1 0.7 - 4.0 K/uL   Monocytes Relative 12 %   Monocytes Absolute 1.8 (H) 0.1 - 1.0 K/uL   Eosinophils Relative 1 %   Eosinophils Absolute 0.1 0.0 - 0.5 K/uL   Basophils Relative 0 %   Basophils Absolute 0.1 0.0 - 0.1 K/uL   Immature Granulocytes 1 %   Abs Immature Granulocytes 0.09 (H) 0.00 - 0.07 K/uL    Comment: Performed at [facility], [address] [place], [place] [address]  Resp Panel by RT-PCR (Flu A&B, Covid) Nasopharyngeal Swab     Status: None   Collection Time: 04/06/21  3:05 AM   Specimen: Nasopharyngeal Swab; Nasopharyngeal(NP) swabs in vial transport medium  Result Value Ref Range   SARS Coronavirus 2 by RT PCR NEGATIVE NEGATIVE    Comment: (NOTE) SARS-CoV-2 target nucleic acids are NOT  DETECTED.  The SARS-CoV-2 RNA is generally detectable in upper respiratory specimens during the acute phase of infection. The lowest concentration of SARS-CoV-2 viral copies this assay can detect is 138 copies/mL. A negative result does not preclude SARS-Cov-2 infection and should not be used as the sole basis for treatment or other patient management decisions. A negative result may occur with  improper specimen collection/handling, submission of specimen other than nasopharyngeal swab, presence of viral mutation(s) within the areas targeted by this assay, and inadequate number of viral copies(<138 copies/mL). A negative result must be combined with clinical observations, patient history, and epidemiological information. The expected result is Negative.  Fact Sheet for Patients:  BloggerCourse.comhttps://www.fda.gov/media/152166/download  Fact Sheet for Healthcare Providers:  SeriousBroker.ithttps://www.fda.gov/media/152162/download  This test is no t yet approved or cleared by the [place] FDA and  has been authorized for detection and/or diagnosis of SARS-CoV-2 by FDA under an Emergency Use Authorization (EUA). This EUA will remain  in effect (meaning this test can be used) for the duration of the COVID-19 declaration under Section 564(b)(1) of the Act, 21 U.S.C.section 360bbb-3(b)(1), unless the authorization is terminated  or revoked sooner.       Influenza A by PCR NEGATIVE NEGATIVE   Influenza B by PCR NEGATIVE NEGATIVE    Comment: (NOTE) The Xpert Xpress SARS-CoV-2/FLU/RSV plus assay is intended as an aid in the diagnosis of influenza from Nasopharyngeal swab specimens and should not be used as a sole basis for treatment. Nasal washings and aspirates are unacceptable for Xpert Xpress SARS-CoV-2/FLU/RSV testing.  Fact Sheet for Patients: BloggerCourse.comhttps://www.fda.gov/media/152166/download  Fact Sheet for Healthcare Providers: SeriousBroker.ithttps://www.fda.gov/media/152162/download  This test is not yet approved or  cleared by the [place] FDA and has been authorized for detection and/or diagnosis of SARS-CoV-2 by FDA under an Emergency Use Authorization (EUA). This EUA will remain in effect (meaning this test can be used) for the duration of the COVID-19 declaration under Section 564(b)(1) of the Act, 21 U.S.C. section 360bbb-3(b)(1), unless the authorization is terminated or revoked.  Performed at [facility], [address] [place], [place] [address]    CT ABDOMEN PELVIS W CONTRAST  Result Date: 04/05/2021 CLINICAL DATA:  Perirectal abscess, left buttock EXAM: CT ABDOMEN AND PELVIS WITH CONTRAST TECHNIQUE: Multidetector CT imaging of the abdomen and pelvis was  performed using the standard protocol following bolus administration of intravenous contrast. CONTRAST:  100mL OMNIPAQUE IOHEXOL 300 MG/ML  SOLN COMPARISON:  06/25/2017 FINDINGS: Lower chest: Lung bases are clear. No effusions. Heart is normal size. Hepatobiliary: No focal hepatic abnormality. Gallbladder unremarkable. Pancreas: No focal abnormality or ductal dilatation. Spleen: No focal abnormality.  Normal size. Adrenals/Urinary Tract: No adrenal abnormality. No focal renal abnormality. No stones or hydronephrosis. Urinary bladder is unremarkable. Stomach/Bowel: Stomach, large and small bowel grossly unremarkable. Vascular/Lymphatic: Aortic atherosclerosis. No evidence of aneurysm or adenopathy. Reproductive: No visible focal abnormality. Other: No free fluid or free air. Abnormal gas and soft tissue/stranding in the left perirectal region and medial left buttock compatible with perirectal abscess. Overall area of inflammation measures 4.9 x 1.8 cm on image 89 of series 3. There is a superior component in the left perirectal space on image 76 with gas and fluid collection measuring approximately 2.5 x 1.3 cm. Musculoskeletal: No acute bony abnormality. IMPRESSION: Inflammation/stranding and gas/fluid collection in the left perirectal space  and medial left buttock compatible with perirectal abscess. Electronically Signed   By: Charlett NoseKevin  Dover M.D.   On: 04/05/2021 20:45    Review of Systems  Constitutional:  Negative for chills and fever.  HENT:  Negative for ear discharge, hearing loss and sore throat.   Eyes:  Negative for discharge.  Respiratory:  Negative for cough and shortness of breath.   Cardiovascular:  Negative for chest pain and leg swelling.  Gastrointestinal:  Negative for abdominal pain, constipation, diarrhea, nausea and vomiting.  Musculoskeletal:  Negative for myalgias and neck pain.  Skin:  Negative for rash.  Allergic/Immunologic: Negative for environmental allergies.  Neurological:  Negative for dizziness and seizures.  Hematological:  Does not bruise/bleed easily.  Psychiatric/Behavioral:  Negative for suicidal ideas.   All other systems reviewed and are negative.  Blood pressure 115/66, pulse 96, temperature 97.9 F (36.6 C), temperature source Oral, resp. rate 16, SpO2 99 %. Physical Exam Constitutional:      Appearance: He is well-developed.     Comments: Conversant No acute distress  HENT:     Head: Normocephalic and atraumatic.  Eyes:     General: Lids are normal. No scleral icterus.    Pupils: Pupils are equal, round, and reactive to light.     Comments: Pupils are equal round and reactive No lid lag Moist conjunctiva  Neck:     Thyroid: No thyromegaly.     Trachea: No tracheal tenderness.     Comments: No cervical lymphadenopathy Cardiovascular:     Rate and Rhythm: Normal rate and regular rhythm.     Heart sounds: No murmur heard. Pulmonary:     Effort: Pulmonary effort is normal.     Breath sounds: Normal breath sounds. No wheezing or rales.  Abdominal:     Tenderness: There is no abdominal tenderness.     Hernia: No hernia is present.  Genitourinary:      Comments: draina Musculoskeletal:     Cervical back: Normal range of motion and neck supple.  Skin:    General: Skin is  warm.     Findings: No rash.     Nails: There is no clubbing.     Comments: Normal skin turgor  Neurological:     Mental Status: He is alert and oriented to person, place, and time.     Comments: Normal gait and station  Psychiatric:        Mood and Affect: Mood normal.  Thought Content: Thought content normal.        Judgment: Judgment normal.     Comments: Appropriate affect     Assessment/Plan 41 year old male with a perirectal abscess.  1.  We will proceed with IV antibiotics 2.  Plan for or, exploration and drainage.  I discussed with the patient the procedure in detail as well as the risks and benefits of the procedure to include but not limited to: Infection, bleeding, damage surrounding structures, possible need for further surgery.  Patient voiced understanding wish to proceed.  Axel Filler, MD 04/06/2021, 6:20 AM

## 2021-04-06 NOTE — Anesthesia Procedure Notes (Signed)
Procedure Name: LMA Insertion Date/Time: 04/06/2021 11:03 AM Performed by: Janace Litten, CRNA Pre-anesthesia Checklist: Patient identified, Emergency Drugs available, Suction available and Patient being monitored Patient Re-evaluated:Patient Re-evaluated prior to induction Oxygen Delivery Method: Circle System Utilized Preoxygenation: Pre-oxygenation with 100% oxygen Induction Type: IV induction Ventilation: Mask ventilation without difficulty LMA: LMA inserted LMA Size: 4.0 Number of attempts: 1 Placement Confirmation: positive ETCO2 Tube secured with: Tape Dental Injury: Teeth and Oropharynx as per pre-operative assessment

## 2021-04-06 NOTE — Plan of Care (Signed)
  Problem: Clinical Measurements: Goal: Ability to maintain clinical measurements within normal limits will improve Outcome: Progressing   

## 2021-04-06 NOTE — ED Notes (Signed)
Provider at bedside

## 2021-04-06 NOTE — Anesthesia Preprocedure Evaluation (Addendum)
Anesthesia Evaluation  Patient identified by MRN, date of birth, ID band Patient awake    Reviewed: Allergy & Precautions, NPO status , Patient's Chart, lab work & pertinent test results  History of Anesthesia Complications Negative for: history of anesthetic complications  Airway Mallampati: II  TM Distance: >3 FB Neck ROM: Full    Dental  (+) Poor Dentition, Dental Advisory Given   Pulmonary Current Smoker and Patient abstained from smoking.,    Pulmonary exam normal        Cardiovascular negative cardio ROS Normal cardiovascular exam     Neuro/Psych negative neurological ROS     GI/Hepatic negative GI ROS, Neg liver ROS,   Endo/Other  negative endocrine ROS  Renal/GU negative Renal ROS     Musculoskeletal negative musculoskeletal ROS (+)   Abdominal   Peds  Hematology negative hematology ROS (+)   Anesthesia Other Findings   Reproductive/Obstetrics                            Anesthesia Physical Anesthesia Plan  ASA: 2  Anesthesia Plan: General   Post-op Pain Management:    Induction: Intravenous  PONV Risk Score and Plan: 2 and Ondansetron, Dexamethasone and Midazolam  Airway Management Planned: LMA  Additional Equipment:   Intra-op Plan:   Post-operative Plan: Extubation in OR  Informed Consent: I have reviewed the patients History and Physical, chart, labs and discussed the procedure including the risks, benefits and alternatives for the proposed anesthesia with the patient or authorized representative who has indicated his/her understanding and acceptance.     Dental advisory given  Plan Discussed with: Anesthesiologist, Surgeon and CRNA  Anesthesia Plan Comments:        Anesthesia Quick Evaluation

## 2021-04-06 NOTE — Op Note (Signed)
° °  Eugene Shelton 04/05/2021 - 04/06/2021   Pre-op Diagnosis: perirectal abscess     Post-op Diagnosis: same  Procedure(s): INCISION AND DRAINAGE PERIRECTAL ABSCESS  Surgeon(s): Abigail Miyamoto, MD  Anesthesia: General  Staff:  Circulator: Hermelinda Dellen, RN Relief Scrub: Myra Rude, RN Scrub Person: Leonides Sake Circulator Assistant: Almyra Free, RN  Estimated Blood Loss: Minimal               CULTURES SENT  Procedure: Patient brought to operating identifies correct patient.  He is placed on the operating table general anesthesia was induced.  He was next placed in the lithotomy position.  His perineal area was prepped and draped in usual sterile fashion.  On the left perianal area already had a opening and draining purulence.  Superior to this on the same side was a fluctuant area.  I made a counterincision in this area with a scalpel after anesthetizing with Marcaine.  This communicated with the lower pole.  I then also communicated with a deeper cavity.  I inserted finger to the anal canal and found no communication with with the abscess.  I did perform an ellipse of skin at the upper incision to make packing easier.  I anesthetized the wounds further Marcaine.  I achieved hemostasis with the cautery.  I then packed both incisions with a 3 inch packing gauze soaked in saline.  Dry gauze and ABDs were placed over this.  The patient tolerated the procedure well.  All the counts were correct at the end of the procedure.  The patient was then extubated in the operating room and taken in stable condition to the recovery room.          Abigail Miyamoto   Date: 04/06/2021  Time: 11:19 AM

## 2021-04-06 NOTE — Transfer of Care (Signed)
Immediate Anesthesia Transfer of Care Note  Patient: Eugene Shelton  Procedure(s) Performed: IRRIGATION AND DEBRIDEMENT PERIRECTAL ABSCESS (Perineum)  Patient Location: PACU  Anesthesia Type:General  Level of Consciousness: awake, alert  and oriented  Airway & Oxygen Therapy: Patient Spontanous Breathing  Post-op Assessment: Report given to RN and Post -op Vital signs reviewed and stable  Post vital signs: Reviewed and stable  Last Vitals:  Vitals Value Taken Time  BP 108/92 04/06/21 1137  Temp    Pulse 91 04/06/21 1140  Resp 12 04/06/21 1140  SpO2 99 % 04/06/21 1140  Vitals shown include unvalidated device data.  Last Pain:  Vitals:   04/06/21 1001  TempSrc:   PainSc: 5          Complications: No notable events documented.

## 2021-04-06 NOTE — Anesthesia Postprocedure Evaluation (Signed)
Anesthesia Post Note  Patient: Eugene Shelton  Procedure(s) Performed: IRRIGATION AND DEBRIDEMENT PERIRECTAL ABSCESS (Perineum)     Patient location during evaluation: PACU Anesthesia Type: General Level of consciousness: sedated Pain management: pain level controlled Vital Signs Assessment: post-procedure vital signs reviewed and stable Respiratory status: spontaneous breathing and respiratory function stable Cardiovascular status: stable Postop Assessment: no apparent nausea or vomiting Anesthetic complications: no   No notable events documented.  Last Vitals:  Vitals:   04/06/21 1237 04/06/21 1252  BP: 112/80 107/71  Pulse: 64 62  Resp: 15 14  Temp: 36.7 C   SpO2: 98% 98%    Last Pain:  Vitals:   04/06/21 1237  TempSrc:   PainSc: 0-No pain                 Pallie Swigert DANIEL

## 2021-04-06 NOTE — ED Provider Notes (Signed)
Emergency Department Provider Note   I have reviewed the triage vital signs and the nursing notes.   HISTORY  Chief Complaint Abscess   HPI Eugene Shelton is a 41 y.o. male with PMH of perirectal abscess in 2019 presents to the ED with rectal pain and drainage. Symptoms progressively worsening over the last 3-4 days. No fever but has had increased pain and swelling since. Patient notes pain with BMs and so has not eaten in the last 24 hours. No fever. Notes that the area did start to drain on the ride to the hospital earlier today. He is not anticoagulated and notes a history of pre-diabetes but does not take medication and does not see a PCP.    Past Medical History:  Diagnosis Date   Medical history non-contributory     Review of Systems  Constitutional: No fever/chills Eyes: No visual changes. ENT: No sore throat. Cardiovascular: Denies chest pain. Respiratory: Denies shortness of breath. Gastrointestinal: No abdominal pain.  No nausea, no vomiting.  No diarrhea.  No constipation. Positive rectal pain and drainage.  Genitourinary: Negative for dysuria. Musculoskeletal: Negative for back pain. Skin: Drainage from rectal area.  Neurological: Negative for headaches   ____________________________________________   PHYSICAL EXAM:  VITAL SIGNS: ED Triage Vitals  Enc Vitals Group     BP 04/05/21 1534 (!) 152/118     Pulse Rate 04/05/21 1534 (!) 134     Resp 04/05/21 1534 16     Temp 04/05/21 1534 98.2 F (36.8 C)     Temp Source 04/05/21 1534 Oral     SpO2 04/05/21 1534 98 %   Constitutional: Alert and oriented. Well appearing and in no acute distress. Eyes: Conjunctivae are normal.  Head: Atraumatic. Nose: No congestion/rhinnorhea. Mouth/Throat: Mucous membranes are moist.  Neck: No stridor.   Cardiovascular: Tachycardia. Good peripheral circulation. Grossly normal heart sounds.   Respiratory: Normal respiratory effort.  No retractions. Lungs  CTAB. Gastrointestinal: Soft and nontender. No distention. Left gluteal induration with 1 cm opening at the skin surface with scant purulent drainage. No crepitus. No other area of palpable fluctuance.  Musculoskeletal: No gross deformities of extremities. Neurologic:  Normal speech and language.  Skin:  Skin is warm, dry and intact. No rash noted.   ____________________________________________   LABS (all labs ordered are listed, but only abnormal results are displayed)  Labs Reviewed  COMPREHENSIVE METABOLIC PANEL - Abnormal; Notable for the following components:      Result Value   Sodium 130 (*)    CO2 20 (*)    Glucose, Bld 151 (*)    Total Protein 8.7 (*)    All other components within normal limits  CBC WITH DIFFERENTIAL/PLATELET - Abnormal; Notable for the following components:   WBC 15.5 (*)    RBC 6.00 (*)    MCV 78.2 (*)    MCH 25.5 (*)    Neutro Abs 12.4 (*)    Monocytes Absolute 1.8 (*)    Abs Immature Granulocytes 0.09 (*)    All other components within normal limits  RESP PANEL BY RT-PCR (FLU A&B, COVID) ARPGX2  LACTIC ACID, PLASMA  LACTIC ACID, PLASMA   ____________________________________________  EKG   EKG Interpretation  Date/Time:  Wednesday April 05 2021 15:37:52 EST Ventricular Rate:  124 PR Interval:  134 QRS Duration: 84 QT Interval:  292 QTC Calculation: 419 R Axis:   182 Text Interpretation: Sinus tachycardia Right atrial enlargement Right superior axis deviation Pulmonary disease pattern Minimal voltage criteria  for LVH, may be normal variant ( Cornell product ) Abnormal ECG No previous ECGs available Confirmed by Dione Booze [id number]) on 04/05/2021 11:38:52 PM        ____________________________________________  RADIOLOGY  CT ABDOMEN PELVIS W CONTRAST  Result Date: 04/05/2021 CLINICAL DATA:  Perirectal abscess, left buttock EXAM: CT ABDOMEN AND PELVIS WITH CONTRAST TECHNIQUE: Multidetector CT imaging of the abdomen and pelvis was  performed using the standard protocol following bolus administration of intravenous contrast. CONTRAST:  OMNIPAQUE IOHEXOL 300 MG/ML  SOLN COMPARISON:  06/25/2017 FINDINGS: Lower chest: Lung bases are clear. No effusions. Heart is normal size. Hepatobiliary: No focal hepatic abnormality. Gallbladder unremarkable. Pancreas: No focal abnormality or ductal dilatation. Spleen: No focal abnormality.  Normal size. Adrenals/Urinary Tract: No adrenal abnormality. No focal renal abnormality. No stones or hydronephrosis. Urinary bladder is unremarkable. Stomach/Bowel: Stomach, large and small bowel grossly unremarkable. Vascular/Lymphatic: Aortic atherosclerosis. No evidence of aneurysm or adenopathy. Reproductive: No visible focal abnormality. Other: No free fluid or free air. Abnormal gas and soft tissue/stranding in the left perirectal region and medial left buttock compatible with perirectal abscess. Overall area of inflammation measures 4.9 x 1.8 cm on image 89 of series 3. There is a superior component in the left perirectal space on image 76 with gas and fluid collection measuring approximately 2.5 x 1.3 cm. Musculoskeletal: No acute bony abnormality. IMPRESSION: Inflammation/stranding and gas/fluid collection in the left perirectal space and medial left buttock compatible with perirectal abscess. Electronically Signed   By: Charlett Nose M.D.   On: 04/05/2021 20:45    ____________________________________________   PROCEDURES  Procedure(s) performed:   Procedures  CRITICAL CARE Performed by: Maia Plan Total critical care time: 35 minutes Critical care time was exclusive of separately billable procedures and treating other patients. Critical care was necessary to treat or prevent imminent or life-threatening deterioration. Critical care was time spent personally by me on the following activities: development of treatment plan with patient and/or surrogate as well as nursing, discussions with  consultants, evaluation of patient's response to treatment, examination of patient, obtaining history from patient or surrogate, ordering and performing treatments and interventions, ordering and review of laboratory studies, ordering and review of radiographic studies, pulse oximetry and re-evaluation of patient's condition.  Alona Bene, MD Emergency Medicine  ____________________________________________   INITIAL IMPRESSION / ASSESSMENT AND PLAN / ED COURSE  Pertinent labs & imaging results that were available during my care of the patient were reviewed by me and considered in my medical decision making (see chart for details).   This patient is Presenting for Evaluation of rectal pain/abscess, which does require a range of treatment options, and is a complaint that involves a high risk of morbidity and mortality.  The Differential Diagnoses include rectal/perirectal abscess, sepsis, rectal cancer, GI bleeding, hemorrhoids, STI.  Critical Interventions- IVF and pain mgmt with CT imaging and surgical consultation.    Reassessment after intervention: Vitals are improving with IVF and pain mgmt. Labs not consistent with sepsis.    I decided to review pertinent External Data, and in summary patient with perirectal abscess in 2019 requiring I&D in the OR with Dr. Dwain Sarna.   Clinical Laboratory Tests Ordered, included CBC, CMP, lactic acid, and COVID/Flu PCR. Patient with leukocytosis but lactate WNL. No AKI. Hyperglycemia w/o DKA. Pseudohyponatremia noted.   Radiologic Tests Ordered, included CT abdomen/pelvis which I individually evaluated. Perirectal abscess noted. Gas in the left gluteal area likely from spontaneously draining area in the superficial left gluteal region  on clinical correlation rather than gas-forming organism.   Cardiac Monitor Tracing which shows sinus rhythm.    Social Determinants of Health Risk poor PCP follow up and lack of health insurance.   Reevaluation  with update and discussion with   Consult complete with General Surgery, Dr. Derrell Lollingamirez.   03:05 AM Discussed patient's case with Surgery, Dr. Derrell Lollingamirez to request admission. Patient and family (if present) updated with plan. Care transferred to surgery service.  I reviewed all nursing notes, vitals, pertinent old records, EKGs, labs, imaging (as available).   Medical Decision Making: Summary:  Patient presents to the ED with rectal pain and purulent drainage. MSE process/note reviewed. CT with evidence of perirectal abscess. Leukocytosis without lactic acid elevation. No hypotension. Tachycardia improved with IVF. Discussed case with General Surgery as above. Plan for admit.   Disposition: Admit  ____________________________________________  FINAL CLINICAL IMPRESSION(S) / ED DIAGNOSES  Final diagnoses:  Perirectal abscess    MEDICATIONS GIVEN DURING THIS VISIT:  Medications  lactated ringers bolus 1,000 mL (has no administration in time range)  lactated ringers infusion (has no administration in time range)  morphine 4 MG/ML injection 4 mg (has no administration in time range)  ondansetron (ZOFRAN) injection 4 mg (has no administration in time range)  iohexol (OMNIPAQUE) 300 MG/ML solution 100 mL (100 mLs Intravenous Contrast Given 04/05/21 2040)    Note:  This document was prepared using Dragon voice recognition software and may include unintentional dictation errors.  Alona BeneJoshua Yury Schaus, MD, [facility] Emergency [facility]    Roxan Yamamoto, Arlyss RepressJoshua G, MD 04/06/21 [phone]

## 2021-04-07 ENCOUNTER — Encounter (HOSPITAL_COMMUNITY): Payer: Self-pay | Admitting: Surgery

## 2021-04-07 ENCOUNTER — Other Ambulatory Visit (HOSPITAL_COMMUNITY): Payer: Self-pay

## 2021-04-07 MED ORDER — OXYCODONE HCL 5 MG PO TABS
5.0000 mg | ORAL_TABLET | Freq: Four times a day (QID) | ORAL | 0 refills | Status: AC | PRN
  Filled 2021-04-07: qty 15, 4d supply, fill #0

## 2021-04-07 MED ORDER — ACETAMINOPHEN 500 MG PO TABS: 1000.0000 mg | ORAL_TABLET | Freq: Four times a day (QID) | ORAL | Status: DC | PRN

## 2021-04-07 MED ORDER — AMOXICILLIN-POT CLAVULANATE 875-125 MG PO TABS
1.0000 | ORAL_TABLET | Freq: Two times a day (BID) | ORAL | 0 refills | Status: AC
  Filled 2021-04-07: qty 10, 5d supply, fill #0

## 2021-04-07 MED ORDER — AMOXICILLIN-POT CLAVULANATE 875-125 MG PO TABS
1.0000 | ORAL_TABLET | Freq: Two times a day (BID) | ORAL | Status: DC
  Administered 2021-04-07: 1 via ORAL
  Filled 2021-04-07: qty 1

## 2021-04-07 MED ORDER — POLYETHYLENE GLYCOL 3350 17 G PO PACK
17.0000 g | PACK | Freq: Every day | ORAL | 0 refills | Status: AC | PRN
Start: 2021-04-07 — End: ?

## 2021-04-07 NOTE — Plan of Care (Signed)

## 2021-04-07 NOTE — Progress Notes (Signed)
Eugene Shelton Heir to be D/C'd  per MD order.  Discussed with the patient and all questions fully answered.  VSS, Skin clean, dry and intact without evidence of skin break down, no evidence of skin tears noted.  IV catheter discontinued intact. Site without signs and symptoms of complications. Dressing and pressure applied.  An After Visit Summary was printed and given to the patient. Patient received prescription.  D/c education completed with patient/family including follow up instructions, medication list, d/c activities limitations if indicated, with other d/c instructions as indicated by MD - patient able to verbalize understanding, all questions fully answered.   Patient instructed to return to ED, call 911, or call MD for any changes in condition.   Patient to be escorted via WC, and D/C home via private auto.

## 2021-04-07 NOTE — Discharge Summary (Signed)
Eugene Shelton   Patient ID: Eugene Shelton MRN: [id number] DOB/AGE: 1980-08-06 41 y.o.  Admit date: 04/05/2021 Discharge date: 04/07/2021  Admitting Diagnosis: Perirectal abscess  Discharge Diagnosis Patient Active Problem List   Diagnosis Date Noted   Perirectal abscess 06/25/2017    Consultants None  Imaging: CT ABDOMEN PELVIS W CONTRAST  Result Date: 04/05/2021 CLINICAL DATA:  Perirectal abscess, left buttock EXAM: CT ABDOMEN AND PELVIS WITH CONTRAST TECHNIQUE: Multidetector CT imaging of the abdomen and pelvis was performed using the standard protocol following bolus administration of intravenous contrast. CONTRAST:  186m OMNIPAQUE IOHEXOL 300 MG/ML  SOLN COMPARISON:  06/25/2017 FINDINGS: Lower chest: Lung bases are clear. No effusions. Heart is normal size. Hepatobiliary: No focal hepatic abnormality. Gallbladder unremarkable. Pancreas: No focal abnormality or ductal dilatation. Spleen: No focal abnormality.  Normal size. Adrenals/Urinary Tract: No adrenal abnormality. No focal renal abnormality. No stones or hydronephrosis. Urinary bladder is unremarkable. Stomach/Bowel: Stomach, large and small bowel grossly unremarkable. Vascular/Lymphatic: Aortic atherosclerosis. No evidence of aneurysm or adenopathy. Reproductive: No visible focal abnormality. Other: No free fluid or free air. Abnormal gas and soft tissue/stranding in the left perirectal region and medial left buttock compatible with perirectal abscess. Overall area of inflammation measures 4.9 x 1.8 cm on image 89 of series 3. There is a superior component in the left perirectal space on image 76 with gas and fluid collection measuring approximately 2.5 x 1.3 cm. Musculoskeletal: No acute bony abnormality. IMPRESSION: Inflammation/stranding and gas/fluid collection in the left perirectal space and medial left buttock compatible with perirectal abscess. Electronically Signed   By: KRolm BaptiseM.D.    On: 04/05/2021 20:45    Procedures Dr. BNinfa Linden(04/06/2021) [facility]  Eugene Shelton a 89yomale who came to the hospital 1/11 with perirectal abscess.  Patient states he previously had this drained in 2019.  Patient comes in with a 2-day history of pain. He was admitted and underwent procedure listed above.  Tolerated procedure well and was transferred to the floor.  Patient was kept on IV zosyn during admission. Patient to get in the shower and pull packing POD#1. On POD#1 the patient was voiding well, tolerating diet, ambulating well, pain well controlled, vital signs stable, incisions c/d/i and felt stable for discharge home.  He will go home with 5 days of augmentin. Cultures pending at time of discharge. Patient will follow up as below and knows to call with questions or concerns.    I have personally reviewed the patients medication history on the [place] controlled substance database.    Physical Exam: General:  Alert, NAD Pulm: rate and effort normal Abd:  Soft, ND GU: perirectal abscess s/p I&D with 2 incisions packed with gauze - no cellulitis, induration or fluctuance, I was unable to pull the packing out due to patient being in too much pain  Allergies as of 04/07/2021   No Known Allergies      Medication List     STOP taking these medications    HYDROcodone-acetaminophen 5-325 MG tablet Commonly known as: NORCO/VICODIN       TAKE these medications    acetaminophen 500 MG tablet Commonly known as: TYLENOL Take 1,000 mg by mouth every 6 (six) hours as needed for mild pain.   amoxicillin-clavulanate 875-125 MG tablet Commonly known as: AUGMENTIN Take 1 tablet by mouth every 12 (twelve) hours for 5 days.   docusate sodium 100 MG capsule Commonly known as:  COLACE Take 1 capsule (100 mg total) by mouth every 12 (twelve) hours.   ibuprofen 200 MG tablet Commonly known as: ADVIL Take 400 mg by mouth every 6  (six) hours as needed for moderate pain.   oxyCODONE 5 MG immediate release tablet Commonly known as: Oxy IR/ROXICODONE Take 1 tablet (5 mg total) by mouth every 6 (six) hours as needed for severe pain.   polyethylene glycol 17 g packet Commonly known as: MiraLax Take 17 g by mouth daily as needed for mild constipation.   ReliOn Confirm Glucose Monitor w/Device Kit 1 Units by Does not apply route as needed. To check glucose periodically throughout the day. Keep a log.          Follow-up [facility] [place]. Go on 04/25/2021.   Specialty: General Surgery Why: Your appointment is 04/25/21 at 11:30 am  Please arrive 30 minutes prior to your appointment to check in and fill out paperwork. Bring photo ID and insurance information. Contact information: [address] [place], NP Follow up.   Specialty: Internal Medicine Why: May 09, 2021 at 10:50 am Contact information: [place] [address] [phone]                  Signed: Wellington Hampshire, [facility] 04/07/2021, 10:17 AM Please see Amion for pager number during day hours 7:00am-4:30pm

## 2021-04-10 LAB — AEROBIC/ANAEROBIC CULTURE W GRAM STAIN (SURGICAL/DEEP WOUND)

## 2021-05-09 ENCOUNTER — Ambulatory Visit (INDEPENDENT_AMBULATORY_CARE_PROVIDER_SITE_OTHER): Payer: Self-pay | Admitting: Primary Care

## 2021-05-09 ENCOUNTER — Other Ambulatory Visit: Payer: Self-pay

## 2021-05-09 VITALS — BP 105/72 | HR 88 | Temp 97.5°F | Ht 69.0 in | Wt 150.0 lb

## 2021-05-09 DIAGNOSIS — Z7689 Persons encountering health services in other specified circumstances: Secondary | ICD-10-CM

## 2021-05-09 DIAGNOSIS — Z87898 Personal history of other specified conditions: Secondary | ICD-10-CM

## 2021-05-09 DIAGNOSIS — Z09 Encounter for follow-up examination after completed treatment for conditions other than malignant neoplasm: Secondary | ICD-10-CM

## 2021-05-09 DIAGNOSIS — R7303 Prediabetes: Secondary | ICD-10-CM

## 2021-05-09 DIAGNOSIS — K611 Rectal abscess: Secondary | ICD-10-CM

## 2021-05-09 LAB — POCT GLYCOSYLATED HEMOGLOBIN (HGB A1C): HbA1c, POC (prediabetic range): 5.5 % — AB (ref 5.7–6.4)

## 2021-05-09 NOTE — Progress Notes (Signed)
Follow up perirectal abscess  Pain 6/10

## 2021-05-09 NOTE — Patient Instructions (Signed)
Go to [facility] (General Surgery) on 04/25/2021; Your appointment is 04/25/21 at 11:30 am  Please arrive 30 minutes prior to your appointment to check in and fill out paperwork. Bring photo ID and insurance information

## 2021-05-09 NOTE — Progress Notes (Signed)
[facility]   Subjective:   [name] is a 41 y.o. male presents for ED on 04/07/21 follow up and establish care. Patient presented secondary to a peritoneal abscess. He previously had this drained in 2019.  Patient  had a 2-day history of pain and area began draining . Past Medical History:  Diagnosis Date   Pre-diabetes      No Known Allergies    Current Outpatient Medications on File Prior to Visit  Medication Sig Dispense Refill   acetaminophen (TYLENOL) 500 MG tablet Take 1,000 mg by mouth every 6 (six) hours as needed for mild pain.     ibuprofen (ADVIL,MOTRIN) 200 MG tablet Take 400 mg by mouth every 6 (six) hours as needed for moderate pain.     oxyCODONE (OXY IR/ROXICODONE) 5 MG immediate release tablet Take 1 tablet (5 mg total) by mouth every 6 (six) hours as needed for severe pain. 15 tablet 0   Blood Glucose Monitoring Suppl (RELION CONFIRM GLUCOSE MONITOR) w/Device KIT 1 Units by Does not apply route as needed. To check glucose periodically throughout the day. Keep a log. (Patient not taking: Reported on 01/30/2018) 1 kit 0   docusate sodium (COLACE) 100 MG capsule Take 1 capsule (100 mg total) by mouth every 12 (twelve) hours. (Patient not taking: Reported on 04/06/2021) 60 capsule 0   polyethylene glycol (MIRALAX) 17 g packet Take 17 g by mouth daily as needed for mild constipation. (Patient not taking: Reported on 05/09/2021) 14 each 0   No current facility-administered medications on file prior to visit.     Review of System: ROS  Objective:  BP 105/72 (BP Location: Left Arm, Patient Position: Sitting, Cuff Size: Normal)    Pulse 88    Temp (!) 97.5 F (36.4 C)    Ht 5' 9" (1.753 m)    Wt 150 lb (68 kg)    SpO2 100%    BMI 22.15 kg/m   Filed Weights   05/09/21 1056  Weight: 150 lb (68 kg)    Physical Exam:   General Appearance: Well nourished, in no apparent distress. Eyes: PERRLA, EOMs, conjunctiva no swelling or  erythema Sinuses: No Frontal/maxillary tenderness ENT/Mouth: Ext aud canals clear, TMs without erythema, bulging. No erythema, swelling, or exudate on post pharynx.  Tonsils not swollen or erythematous. Hearing normal.  Neck: Supple, thyroid normal.  Respiratory: Respiratory effort normal, BS equal bilaterally without rales, rhonchi, wheezing or stridor.  Cardio: RRR with no MRGs. Brisk peripheral pulses without edema.  Abdomen: Soft, + BS.  Non tender, no guarding, rebound, hernias, masses. Lymphatics: Non tender without lymphadenopathy.  Musculoskeletal: Full ROM, 5/5 strength, normal gait.  Skin: Warm, dry without rashes, lesions, ecchymosis.  Neuro: Cranial nerves intact. Normal muscle tone, no cerebellar symptoms. Sensation intact.  Psych: Awake and oriented X 3, normal affect, Insight and Judgment appropriate.    Assessment:  Eugene Shelton was seen today for hospitalization follow-up and perirectal abscess.  Diagnoses and all orders for this visit:  History of prediabetes -     HgB A1c 5.5 per ADA guidelines not a diabetic   Perirectal abscess I&D of Perirectal Abscess done in hospital . [facility] missed appt due to child sent home sick- emphasized and provided information for f/u appt call and reschedule  Encounter to establish care Establish care with PCP  Hospital discharge follow-up Retrieved from hospital discharge  Go to [facility] [place] (General Surgery) on 04/25/2021; Your appointment is 04/25/21  at 11:30 am  Please arrive 30 minutes prior to your appointment to check in and fill out paperwork. Bring photo ID and insurance information. Follow up with Kerin Perna, NP (Internal Medicine); May 09, 2021 at 10:50 am   This note has been created with Surveyor, quantity. Any transcriptional errors are unintentional.   Kerin Perna, NP 05/14/2021, 9:34 PM

## 2023-01-12 IMAGING — CT CT ABD-PELV W/ CM
2 of 5 series · 16 of 46 positions shown, 18 images · IV contrast (APPLIED)
Comparison: 06/25/2017

CLINICAL DATA: Perirectal abscess, left buttock

EXAM:
CT ABDOMEN AND PELVIS WITH CONTRAST
TECHNIQUE: Multidetector CT imaging of the abdomen and pelvis was performed
using the standard protocol following bolus administration of
intravenous contrast.
CONTRAST:  100mL OMNIPAQUE IOHEXOL 300 MG/ML  SOLN

[Series 3: abd/ pelvis 5.0 i30f 2 · axial · 0.79mm/px · z∈[+861,+1296]mm · 13 of 99 slices shown, 15 images]
[im 6/99  soft-tissue]
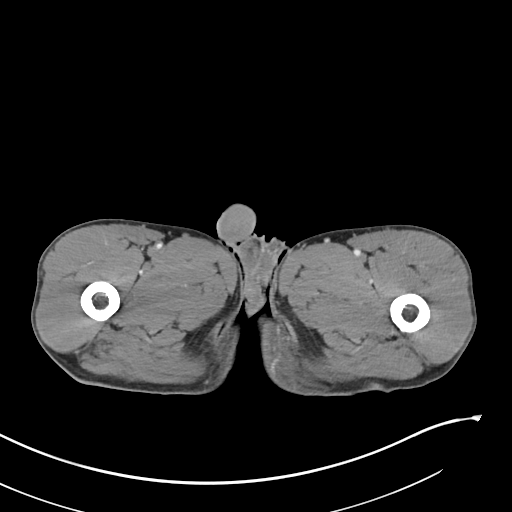
[im 6/99  bone]
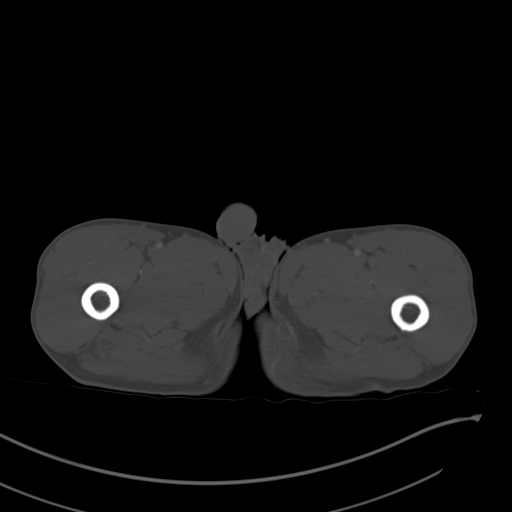
[im 16/99  soft-tissue]
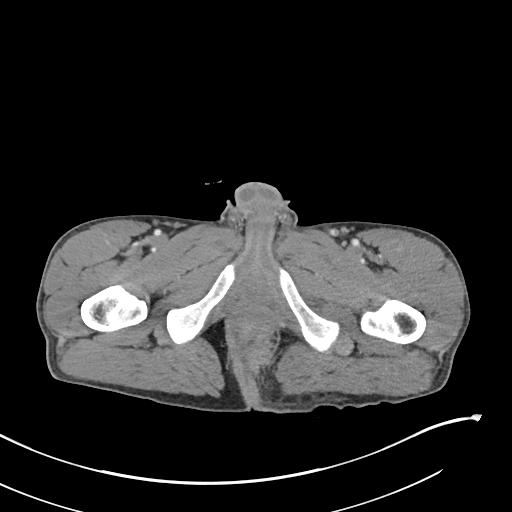
[im 21/99  soft-tissue]
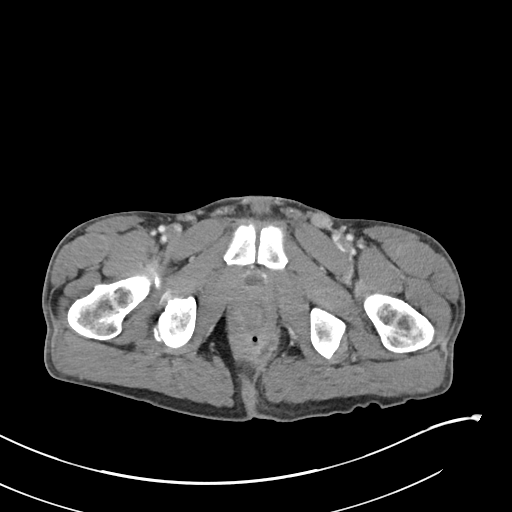
[im 26/99  soft-tissue]
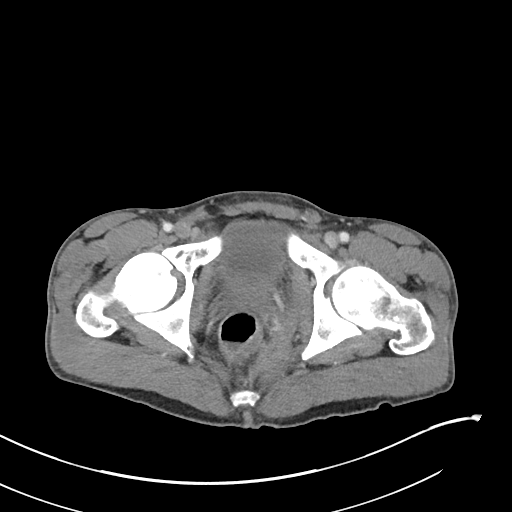
[im 37/99  soft-tissue]
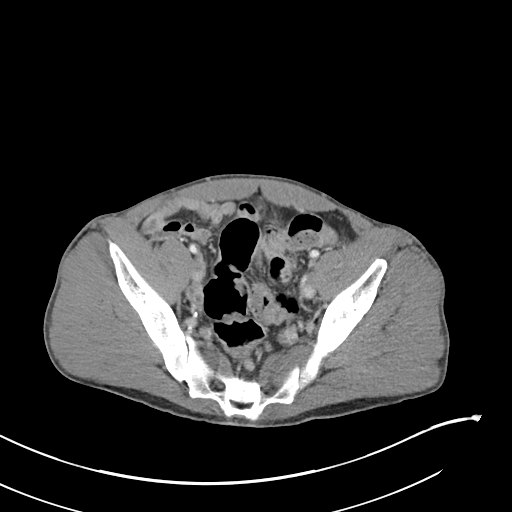
[im 42/99  soft-tissue]
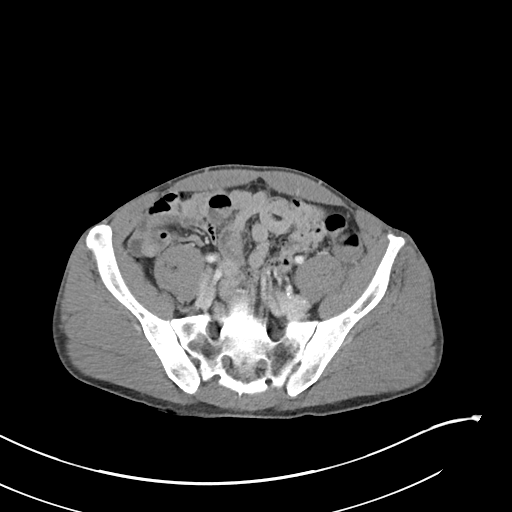
[im 52/99  soft-tissue]
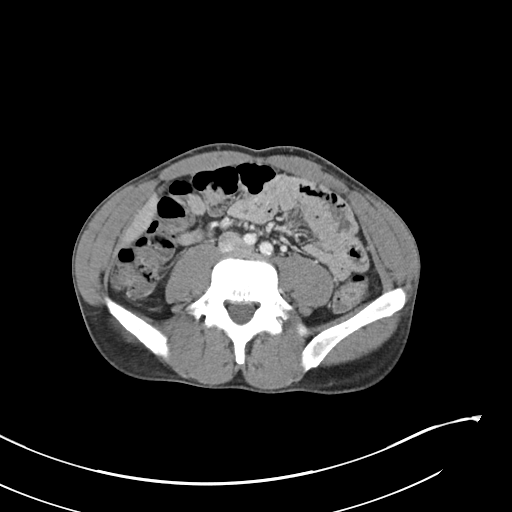
[im 57/99  soft-tissue]
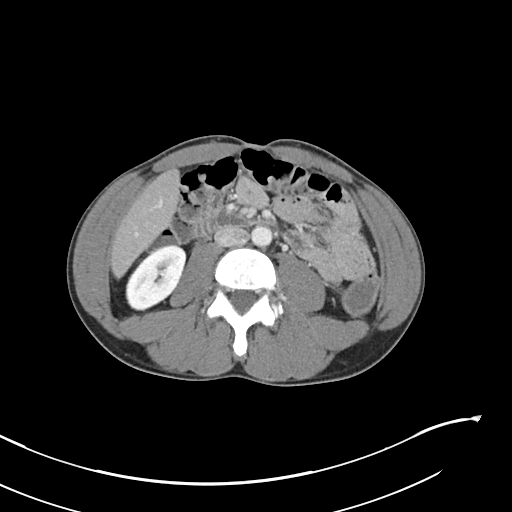
[im 62/99  soft-tissue]
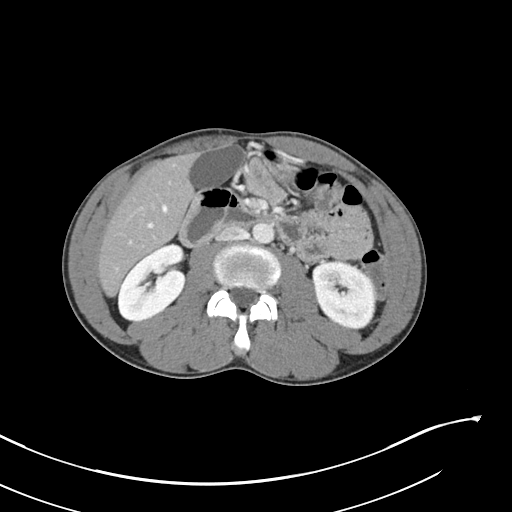
[im 62/99  bone]
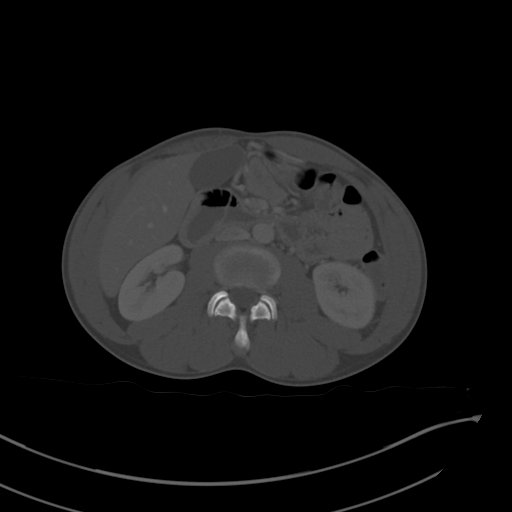
[im 73/99  soft-tissue]
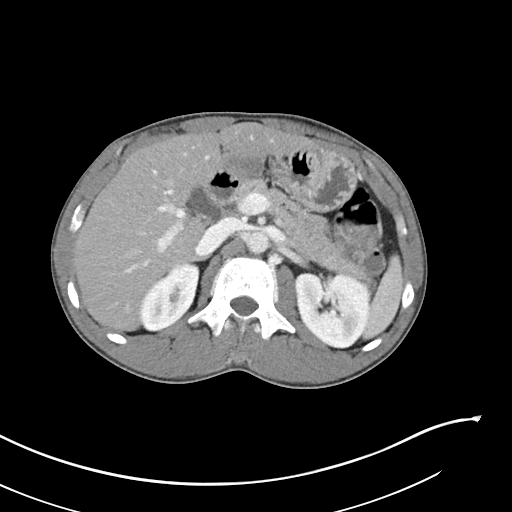
[im 78/99  soft-tissue]
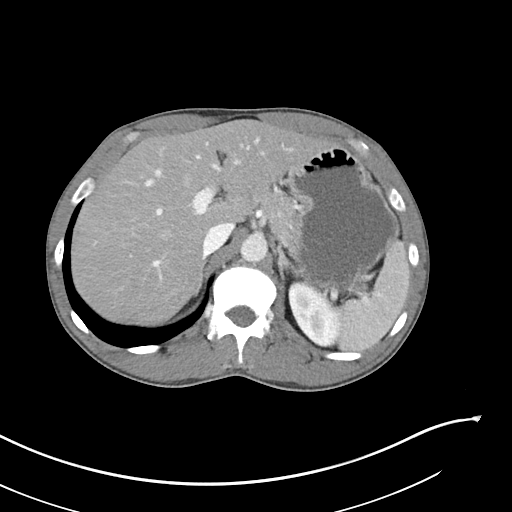
[im 83/99  soft-tissue]
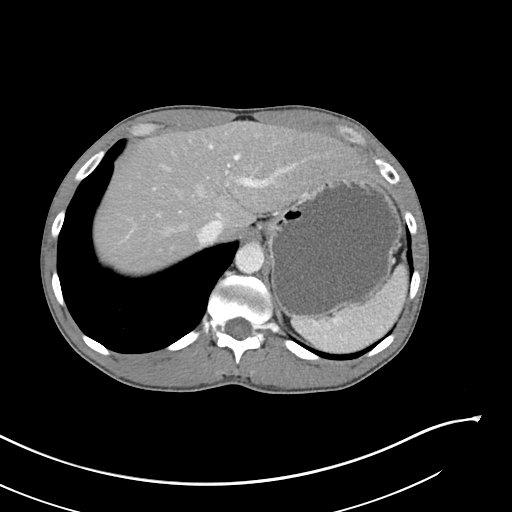
[im 93/99  soft-tissue]
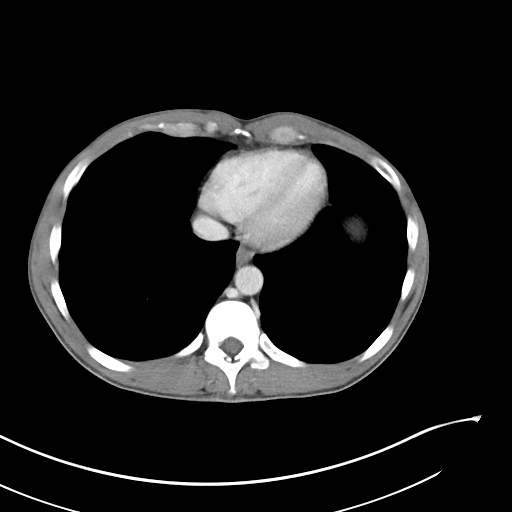

[Series 6: coronal soft tissue · coronal · 0.72mm/px · 3 of 101 slices shown]
[im 34/101  soft-tissue]
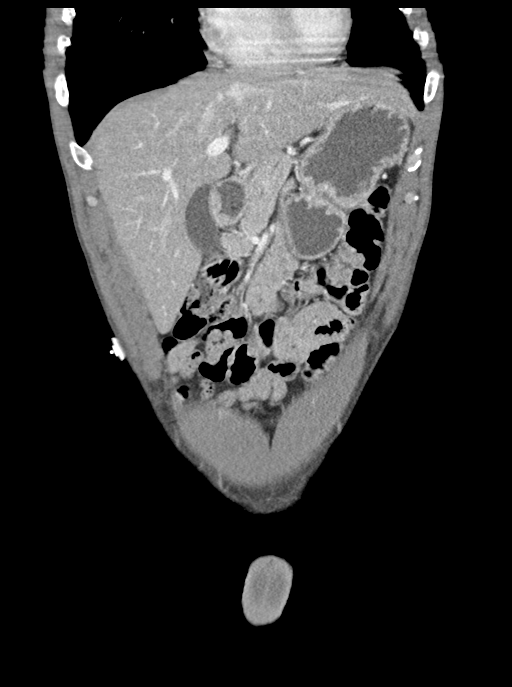
[im 45/101  soft-tissue]
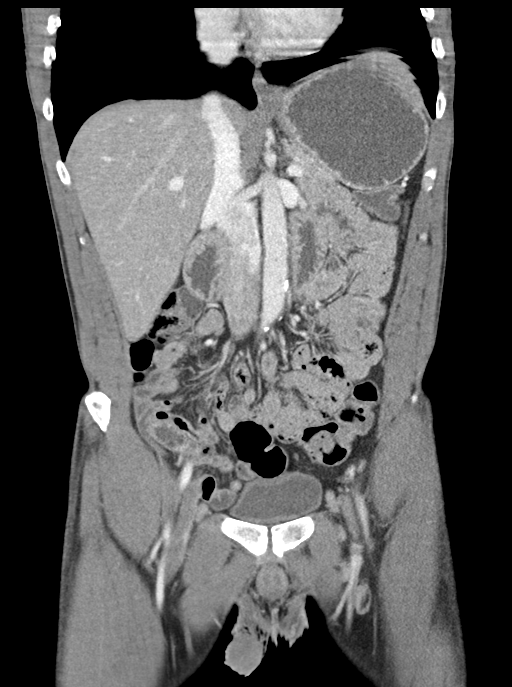
[im 56/101  soft-tissue]
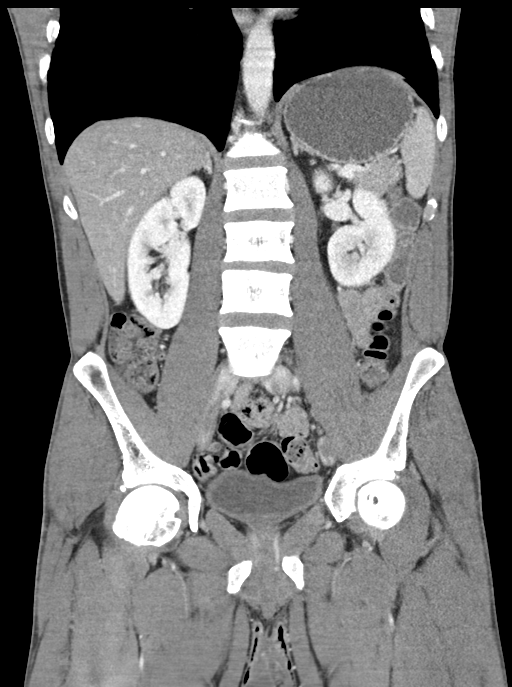

[16 of 46 positions shown; findings below may reference images not displayed]

FINDINGS: Lower chest: Lung bases are clear. No effusions. Heart is normal
size.

Hepatobiliary: No focal hepatic abnormality. Gallbladder
unremarkable.

Pancreas: No focal abnormality or ductal dilatation.

Spleen: No focal abnormality.  Normal size.

Adrenals/Urinary Tract: No adrenal abnormality. No focal renal
abnormality. No stones or hydronephrosis. Urinary bladder is
unremarkable.

Stomach/Bowel: Stomach, large and small bowel grossly unremarkable.

Vascular/Lymphatic: Aortic atherosclerosis. No evidence of aneurysm
or adenopathy.

Reproductive: No visible focal abnormality.

Other: No free fluid or free air. Abnormal gas and soft
tissue/stranding in the left perirectal region and medial left
buttock compatible with perirectal abscess. Overall area of
inflammation measures 4.9 x 1.8 cm on image 89 of series 3. There is
a superior component in the left perirectal space on image 76 with
gas and fluid collection measuring approximately 2.5 x 1.3 cm.

Musculoskeletal: No acute bony abnormality.
IMPRESSION: Inflammation/stranding and gas/fluid collection in the left
perirectal space and medial left buttock compatible with perirectal
abscess.

## 2023-08-25 DEATH — deceased
